# Patient Record
Sex: Male | Born: 1965
Health system: Southern US, Community
[De-identification: ages and names within clinical notes are randomized; demographics above are authoritative.]

## PROBLEM LIST (undated history)

## (undated) DIAGNOSIS — I10 Essential (primary) hypertension: Secondary | ICD-10-CM

## (undated) HISTORY — PX: RECONSTRUCTION OF EYELID: SHX6576

## (undated) HISTORY — DX: Essential (primary) hypertension: I10

## (undated) HISTORY — PX: LIPOMA EXCISION: SHX5283

---

## 2014-06-03 LAB — BASIC METABOLIC PANEL
BUN: 13 mg/dL (ref 4–21)
Creatinine: 1.2 mg/dL (ref <=1.3)
Glucose: 93 mg/dL

## 2014-06-03 LAB — LIPID PANEL
Cholesterol: 217 mg/dL — AB (ref 0–200)
HDL: 34 mg/dL — AB (ref 35–70)
LDL Cholesterol: 152 mg/dL
Triglycerides: 157 mg/dL (ref 40–160)

## 2015-05-02 DIAGNOSIS — L309 Dermatitis, unspecified: Secondary | ICD-10-CM | POA: Insufficient documentation

## 2015-05-02 DIAGNOSIS — E669 Obesity, unspecified: Secondary | ICD-10-CM | POA: Insufficient documentation

## 2015-05-02 DIAGNOSIS — IMO0001 Reserved for inherently not codable concepts without codable children: Secondary | ICD-10-CM | POA: Insufficient documentation

## 2015-05-02 DIAGNOSIS — R030 Elevated blood-pressure reading, without diagnosis of hypertension: Secondary | ICD-10-CM

## 2015-05-02 DIAGNOSIS — Z0000 Encounter for general adult medical examination without abnormal findings: Secondary | ICD-10-CM | POA: Insufficient documentation

## 2015-06-06 ENCOUNTER — Encounter: Payer: Self-pay | Admitting: Family Medicine

## 2015-06-06 ENCOUNTER — Ambulatory Visit (INDEPENDENT_AMBULATORY_CARE_PROVIDER_SITE_OTHER): Payer: 59 | Admitting: Family Medicine

## 2015-06-06 VITALS — BP 130/80 | HR 78 | Ht 73.0 in | Wt 277.0 lb

## 2015-06-06 DIAGNOSIS — F17219 Nicotine dependence, cigarettes, with unspecified nicotine-induced disorders: Secondary | ICD-10-CM | POA: Diagnosis not present

## 2015-06-06 DIAGNOSIS — E669 Obesity, unspecified: Secondary | ICD-10-CM

## 2015-06-06 DIAGNOSIS — IMO0001 Reserved for inherently not codable concepts without codable children: Secondary | ICD-10-CM

## 2015-06-06 DIAGNOSIS — L309 Dermatitis, unspecified: Secondary | ICD-10-CM

## 2015-06-06 DIAGNOSIS — Z0000 Encounter for general adult medical examination without abnormal findings: Secondary | ICD-10-CM | POA: Diagnosis not present

## 2015-06-06 DIAGNOSIS — R030 Elevated blood-pressure reading, without diagnosis of hypertension: Secondary | ICD-10-CM | POA: Diagnosis not present

## 2015-06-06 LAB — POCT URINALYSIS DIPSTICK
Bilirubin, UA: NEGATIVE
Glucose, UA: NEGATIVE
Ketones, UA: NEGATIVE
Leukocytes, UA: NEGATIVE
NITRITE UA: NEGATIVE
PH UA: 5
Protein, UA: NEGATIVE
RBC UA: NEGATIVE
Spec Grav, UA: >=1.03
Urobilinogen, UA: 0.2

## 2015-06-06 MED ORDER — CLOTRIMAZOLE-BETAMETHASONE 1-0.05 % EX CREA: 1.0000 "application " | TOPICAL_CREAM | Freq: Every day | CUTANEOUS | Status: DC

## 2015-06-06 MED ORDER — HYDROCHLOROTHIAZIDE 12.5 MG PO TABS: 12.5000 mg | ORAL_TABLET | Freq: Every day | ORAL | Status: DC

## 2015-06-06 NOTE — Patient Instructions (Signed)
Smoking: Ways to Quit   Know why you want to quit.   When you quit smoking, your body gets to work repairing damaged tissues. Here are some of the health benefits:   You stop the destruction of your lungs.  Your lungs are better able to fight colds, and other respiratory infections.  You decrease your risk of cancer, heart disease, strokes, and circulation problems.   In addition, when you quit you will:   Feel more in control of your life.  Have better smelling hair, breath, clothes, home, and car.  Have more stamina for activities.  Save money.  Protect your family and friends from the dangers of secondhand smoke.   Smoking is an addictive habit. Most former smokers make several attempts to quit before they finally succeed. So, never say, "I can't." Just keep trying.   Set a quit date.   Set a date for when you will stop smoking. Don't buy cigarettes to carry you beyond your last day. Tell your family and friends you plan to quit, and ask for their support and encouragement. Ask them not to offer you cigarettes.  Make a plan.   5 Days Before Your Quit Date   Think about your reasons for quitting.  Tell your friends and family you are planning to quit.  Stop buying cigarettes.   4 Days Before Your Quit Date   Pay attention to when and why you smoke.  Think of other things to hold in your hand instead of a cigarette.  Think of habits or routines to change.   3 Days Before Your Quit Date   Plan what you will do with the extra money when you stop buying cigarettes.  Think of whom you can reach out to when you need help.   2 Days Before Your Quit Date   Consider buying nonprescription nicotine patches or nicotine gum. Or see your health care provider to get a prescription for the nicotine inhaler, nasal spray, or other medicine that can help.     1 Day Before Your Quit Date   Put away lighters and ashtrays.  Throw away all cigarettes and matches - no emergency stashes  are allowed!  Clean your clothes to get rid of the smell of cigarette smoke.   Quit Day   Keep very busy.  Remind family and friends that this is your quit day.  Stay away from alcohol.  Stay away from places where you used to smoke and people you used to smoke with.  Give yourself a treat or do something else special.   Commit to staying quit.   Make sure that all your cigarettes and ashtrays are thrown away.   If you keep cigarettes or ashtrays around, sooner or later you'll break down and smoke one, then another, then another, and so on. Throw them away. Make it hard to start again.   Because you are used to having something in your mouth, you may want to chew gum as a substitute for smoking. Or munch on carrots or celery.   Spend time with nonsmokers rather than with smokers.   Think of yourself as a nonsmoker. Tell other people that you are a nonsmoker (for example, in restaurants). Stay away from places where there are a lot of smokers, such as bars. Avoid spending time with smokers. You can't tell others not to smoke, but you don't have to sit with them while they do. Plan on walking away from cigarette smoke. Spend time   with nonsmokers and sit in the nonsmoking section of restaurants.   Be prepared for relapse or difficult situations.   Most people who go back to smoking cigarettes do so within the first 3 months after quitting. Many people try 5 or more times before they successfully quit. Avoid drinking alcohol, because it lowers your chances of success. Don't be distracted by the weight you may gain after quitting. Smokers usually do not gain more than 10 pounds when they stop smoking. Learn new ways to improve your mood and overcome depression.   Start an exercise program.   As you become more fit, you will not want the nicotine effects in your body. Regular exercise will also help keep you from gaining weight.   Keep your hands busy.   You may not know what to do with  your hands for a while. Try reading, knitting, needlework, pottery, drawing, making a plastic model, or doing a jigsaw puzzle. Join special interest groups that keep you involved in your hobby.      Take on new activities.   Change your routine. Take on new activities that don't include smoking. Join an exercise group and work out regularly. Sign up for an evening class or a join a study group at your place of worship. Go on more outings with your family or friends. Learn ways to relax and manage stress.   Join a quit-smoking program if it helps.   Some people do better in groups, or with a set of instructions to follow. That's fine, too. Remember, the goal is to quit smoking. It doesn't matter how you do it.   Consider using nicotine replacement therapy.   Nicotine is the drug that is in tobacco. You can use nicotine patches or gum, available without a prescription at your local pharmacy, to help you quit smoking. Quitting smoking is a two-step process. It includes breaking the physical addiction to nicotine and breaking the smoking habit. Nicotine replacement helps take care of the nicotine addiction so that you can focus on breaking the habit.   Your health care provider can prescribe nicotine substitutes that can almost double your chances of quitting for good. They are:   Zyban (bupropion HCL)  nicotine inhaler  nicotine lozenge  nicotine nasal spray  nicotine patch.   None of these treatments is a miracle cure. Quitting can be hard work, but you can learn to live without cigarettes in your daily life.  

## 2015-06-06 NOTE — Progress Notes (Signed)
Name: Adam Olson   MRN: [id number]    DOB: 28-Jun-1966   Date:06/06/2015       Progress Note  Subjective  Chief Complaint  Chief Complaint  Patient presents with  . Annual Exam    no complaints  . Hypertension    need refill    Hypertension This is a recurrent problem. The current episode started more than 1 year ago. The problem has been resolved since onset. The problem is controlled. Pertinent negatives include no anxiety, blurred vision, chest pain, headaches, malaise/fatigue, neck pain, orthopnea, palpitations, peripheral edema, PND, shortness of breath or sweats. There are no associated agents to hypertension. There are no known risk factors for coronary artery disease. Past treatments include diuretics. The current treatment provides mild improvement. There are no compliance problems.  There is no history of angina, kidney disease, CAD/MI, CVA, heart failure, left ventricular hypertrophy, PVD or retinopathy. There is no history of chronic renal disease.    No problem-specific assessment & plan notes found for this encounter.   Past Medical History  Diagnosis Date  . Hypertension     Past Surgical History  Procedure Laterality Date  . Lipoma excision      scapula  . Reconstruction of eyelid      Family History  Problem Relation Age of Onset  . Cancer Maternal Grandmother   . Cancer Maternal Grandfather     History   Social History  . Marital Status: Unknown    Spouse Name: N/A  . Number of Children: N/A  . Years of Education: N/A   Occupational History  . Not on file.   Social History Main Topics  . Smoking status: Current Some Day Smoker    Types: Cigars  . Smokeless tobacco: Not on file  . Alcohol Use: 0.0 oz/week    0 Standard drinks or equivalent per week  . Drug Use: No  . Sexual Activity: Not Currently   Other Topics Concern  . Not on file   Social History Narrative    No Known Allergies   Review of Systems  Constitutional: Negative  for fever, weight loss and malaise/fatigue.  HENT: Negative.   Eyes: Negative for blurred vision.  Respiratory: Negative for shortness of breath.   Cardiovascular: Negative for chest pain, palpitations, orthopnea and PND.  Gastrointestinal: Negative for heartburn, nausea and vomiting.  Genitourinary: Negative for dysuria, urgency and hematuria.  Musculoskeletal: Negative for neck pain.  Skin: Positive for rash.       feet  Neurological: Negative for dizziness, tingling, loss of consciousness and headaches.  Endo/Heme/Allergies: Negative.   Psychiatric/Behavioral: Negative for depression and suicidal ideas. The patient is not nervous/anxious.      Objective  Filed Vitals:   06/06/15 0853  BP: 130/80  Pulse: 78  Height: 6\' 1"  (1.854 m)  Weight: 277 lb (125.646 kg)    Physical Exam  Constitutional: He is oriented to person, place, and time and well-developed, well-nourished, and in no distress.  HENT:  Head: Normocephalic.  Right Ear: External ear normal.  Left Ear: External ear normal.  Nose: Nose normal.  Mouth/Throat: Oropharynx is clear and moist.  Eyes: Conjunctivae and EOM are normal. Pupils are equal, round, and reactive to light. Right eye exhibits no discharge. Left eye exhibits no discharge. No scleral icterus.  Neck: Normal range of motion. Neck supple. No JVD present. No tracheal deviation present. No thyromegaly present.  Cardiovascular: Normal rate, regular rhythm, normal heart sounds and intact distal pulses.  Exam  reveals no gallop and no friction rub.   No murmur heard. Pulmonary/Chest: Effort normal and breath sounds normal. No respiratory distress. He has no wheezes. He has no rales.  Abdominal: Soft. Bowel sounds are normal. He exhibits no mass. There is no hepatosplenomegaly. There is no tenderness. There is no rebound, no guarding and no CVA tenderness.  Genitourinary: Prostate normal and penis normal. Guaiac positive stool.  Musculoskeletal: Normal range  of motion. He exhibits no edema or tenderness.  Lymphadenopathy:    He has no cervical adenopathy.  Neurological: He is alert and oriented to person, place, and time. He has normal sensation, normal strength, normal reflexes and intact cranial nerves. No cranial nerve deficit.  Skin: Skin is warm. No rash noted.  Psychiatric: Mood and affect normal.  Nursing note and vitals reviewed.     Assessment & Plan  Problem List Items Addressed This Visit      Musculoskeletal and Integument   Dermatitis, eczematoid   Relevant Medications   clotrimazole-betamethasone (LOTRISONE) cream     Other   Blood pressure elevated   Relevant Medications   hydrochlorothiazide (HYDRODIURIL) 12.5 MG tablet   Other Relevant Orders   Renal Function Panel   POCT Urinalysis Dipstick   Adiposity   Relevant Orders   Lipid Profile    Other Visit Diagnoses    Annual physical exam    -  Primary    Relevant Orders    POCT Occult Blood Stool    Cigarette nicotine dependence with nicotine-induced disorder             Dr. Elizabeth Sauer [facility]  06/06/2015

## 2015-06-07 LAB — LIPID PANEL
Chol/HDL Ratio: 5.2 ratio units — ABNORMAL HIGH (ref 0.0–5.0)
Cholesterol, Total: 241 mg/dL — ABNORMAL HIGH (ref 100–199)
HDL: 46 mg/dL (ref >39)
LDL CALC: 172 mg/dL — AB (ref 0–99)
TRIGLYCERIDES: 113 mg/dL (ref 0–149)
VLDL CHOLESTEROL CAL: 23 mg/dL (ref 5–40)

## 2015-06-07 LAB — RENAL FUNCTION PANEL: PHOSPHORUS: 3.4 mg/dL (ref 2.5–4.5)

## 2016-04-24 DIAGNOSIS — G4733 Obstructive sleep apnea (adult) (pediatric): Secondary | ICD-10-CM | POA: Diagnosis not present

## 2016-04-24 DIAGNOSIS — J343 Hypertrophy of nasal turbinates: Secondary | ICD-10-CM | POA: Diagnosis not present

## 2016-04-24 DIAGNOSIS — R0683 Snoring: Secondary | ICD-10-CM | POA: Diagnosis not present

## 2016-04-30 ENCOUNTER — Encounter: Payer: Self-pay | Admitting: Family Medicine

## 2016-04-30 ENCOUNTER — Ambulatory Visit (INDEPENDENT_AMBULATORY_CARE_PROVIDER_SITE_OTHER): Payer: BLUE CROSS/BLUE SHIELD | Admitting: Family Medicine

## 2016-04-30 VITALS — BP 148/100 | HR 100 | Ht 73.0 in | Wt 278.0 lb

## 2016-04-30 DIAGNOSIS — G4730 Sleep apnea, unspecified: Secondary | ICD-10-CM

## 2016-04-30 DIAGNOSIS — G4733 Obstructive sleep apnea (adult) (pediatric): Secondary | ICD-10-CM | POA: Diagnosis not present

## 2016-04-30 DIAGNOSIS — I10 Essential (primary) hypertension: Secondary | ICD-10-CM | POA: Diagnosis not present

## 2016-04-30 MED ORDER — LISINOPRIL-HYDROCHLOROTHIAZIDE 10-12.5 MG PO TABS: 1.0000 | ORAL_TABLET | Freq: Every day | ORAL | Status: DC

## 2016-04-30 NOTE — Progress Notes (Signed)
Name: Adam Olson   MRN: [id number]    DOB: 12-May-1966   Date:04/30/2016       Progress Note  Subjective  Chief Complaint  Chief Complaint  Patient presents with  . Hypertension    B/P has been elevated- 167/110-120    HPI Comments: Patient having sleep apnea symptoms.  Positive: snoring/ apneic episode/   Negative daytime somnolence/ sitting in chair/ driving/   Hypertension This is a chronic problem. The current episode started in the past 7 days. The problem has been waxing and waning since onset. The problem is uncontrolled. Pertinent negatives include no anxiety, blurred vision, chest pain, headaches, malaise/fatigue, neck pain, orthopnea, palpitations, peripheral edema, PND, shortness of breath or sweats. There are no associated agents to hypertension. Risk factors for coronary artery disease include obesity, male gender and smoking/tobacco exposure. Past treatments include diuretics. The current treatment provides moderate improvement. There are no compliance problems.  There is no history of angina, kidney disease, CAD/MI, CVA, heart failure, left ventricular hypertrophy, PVD, renovascular disease or retinopathy. There is no history of chronic renal disease or a hypertension causing med.    No problem-specific assessment & plan notes found for this encounter.   Past Medical History  Diagnosis Date  . Hypertension     Past Surgical History  Procedure Laterality Date  . Lipoma excision      scapula  . Reconstruction of eyelid      Family History  Problem Relation Age of Onset  . Cancer Maternal Grandmother   . Cancer Maternal Grandfather     Social History   Social History  . Marital Status: Unknown    Spouse Name: N/A  . Number of Children: N/A  . Years of Education: N/A   Occupational History  . Not on file.   Social History Main Topics  . Smoking status: Current Every Day Smoker    Types: Cigars  . Smokeless tobacco: Not on file  . Alcohol Use: 0.0  oz/week    0 Standard drinks or equivalent per week  . Drug Use: No  . Sexual Activity: Not Currently   Other Topics Concern  . Not on file   Social History Narrative    No Known Allergies   Review of Systems  Constitutional: Negative for fever, chills, weight loss and malaise/fatigue.  HENT: Negative for ear discharge, ear pain and sore throat.   Eyes: Negative for blurred vision.  Respiratory: Negative for cough, sputum production, shortness of breath and wheezing.   Cardiovascular: Negative for chest pain, palpitations, orthopnea, leg swelling and PND.  Gastrointestinal: Negative for heartburn, nausea, abdominal pain, diarrhea, constipation, blood in stool and melena.  Genitourinary: Negative for dysuria, urgency, frequency and hematuria.  Musculoskeletal: Negative for myalgias, back pain, joint pain and neck pain.  Skin: Negative for rash.  Neurological: Negative for dizziness, tingling, sensory change, focal weakness and headaches.  Endo/Heme/Allergies: Negative for environmental allergies and polydipsia. Does not bruise/bleed easily.  Psychiatric/Behavioral: Negative for depression and suicidal ideas. The patient is not nervous/anxious and does not have insomnia.      Objective  Filed Vitals:   04/30/16 1013  BP: 148/100  Pulse: 100  Height:  (1.854 m)  Weight: 278 lb (126.1 kg)    Physical Exam  Constitutional: He is oriented to person, place, and time and well-developed, well-nourished, and in no distress.  HENT:  Head: Normocephalic.  Right Ear: External ear normal.  Left Ear: External ear normal.  Nose: Nose normal.  Mouth/Throat: Oropharynx is clear and moist.  Eyes: Conjunctivae and EOM are normal. Pupils are equal, round, and reactive to light. Right eye exhibits no discharge. Left eye exhibits no discharge. No scleral icterus.  Neck: Normal range of motion. Neck supple. No JVD present. No tracheal deviation present. No thyromegaly present.   Cardiovascular: Normal rate, regular rhythm, normal heart sounds and intact distal pulses.  Exam reveals no gallop and no friction rub.   No murmur heard. Pulmonary/Chest: Breath sounds normal. No respiratory distress. He has no wheezes. He has no rales.  Abdominal: Soft. Bowel sounds are normal. He exhibits no mass. There is no hepatosplenomegaly. There is no tenderness. There is no rebound, no guarding and no CVA tenderness.  Musculoskeletal: Normal range of motion. He exhibits no edema or tenderness.  Lymphadenopathy:    He has no cervical adenopathy.  Neurological: He is alert and oriented to person, place, and time. He has normal sensation, normal strength and intact cranial nerves. No cranial nerve deficit.  Skin: Skin is warm. No rash noted.  Psychiatric: Mood and affect normal.  Nursing note and vitals reviewed.     Assessment & Plan  Problem List Items Addressed This Visit    None    Visit Diagnoses    Essential hypertension    -  Primary    Relevant Medications    lisinopril-hydrochlorothiazide (PRINZIDE,ZESTORETIC) 10-12.5 MG tablet    Sleep apnea in adult             Dr. Hayden Rasmusseneanna Ione [facility]  04/30/2016

## 2016-05-21 ENCOUNTER — Encounter: Payer: Self-pay | Admitting: Family Medicine

## 2016-05-22 ENCOUNTER — Ambulatory Visit (INDEPENDENT_AMBULATORY_CARE_PROVIDER_SITE_OTHER): Payer: BLUE CROSS/BLUE SHIELD | Admitting: Family Medicine

## 2016-05-22 ENCOUNTER — Encounter: Payer: Self-pay | Admitting: Family Medicine

## 2016-05-22 VITALS — BP 120/82 | HR 72 | Ht 73.0 in | Wt 280.0 lb

## 2016-05-22 DIAGNOSIS — E669 Obesity, unspecified: Secondary | ICD-10-CM

## 2016-05-22 DIAGNOSIS — I10 Essential (primary) hypertension: Secondary | ICD-10-CM | POA: Diagnosis not present

## 2016-05-22 DIAGNOSIS — Z1211 Encounter for screening for malignant neoplasm of colon: Secondary | ICD-10-CM

## 2016-05-22 DIAGNOSIS — Z0000 Encounter for general adult medical examination without abnormal findings: Secondary | ICD-10-CM

## 2016-05-22 LAB — HEMOCCULT GUIAC POC 1CARD (OFFICE): Fecal Occult Blood, POC: NEGATIVE

## 2016-05-22 MED ORDER — LISINOPRIL-HYDROCHLOROTHIAZIDE 10-12.5 MG PO TABS: 1.0000 | ORAL_TABLET | Freq: Every day | ORAL | Status: DC

## 2016-05-22 NOTE — Progress Notes (Signed)
Name: Adam Olson   MRN: [id number]    DOB: 1966/09/01   Date:05/22/2016       Progress Note  Subjective  Chief Complaint  Chief Complaint  Patient presents with  . Annual Exam    no issues    HPI Comments: Patient presents for physical exam.   No problem-specific assessment & plan notes found for this encounter.   Past Medical History  Diagnosis Date  . Hypertension     Past Surgical History  Procedure Laterality Date  . Lipoma excision      scapula  . Reconstruction of eyelid      Family History  Problem Relation Age of Onset  . Cancer Maternal Grandmother   . Cancer Maternal Grandfather     Social History   Social History  . Marital Status: Unknown    Spouse Name: N/A  . Number of Children: N/A  . Years of Education: N/A   Occupational History  . Not on file.   Social History Main Topics  . Smoking status: Current Every Day Smoker    Types: Cigars  . Smokeless tobacco: Not on file  . Alcohol Use: 0.0 oz/week    0 Standard drinks or equivalent per week  . Drug Use: No  . Sexual Activity: Not Currently   Other Topics Concern  . Not on file   Social History Narrative    No Known Allergies   Review of Systems  Constitutional: Negative for fever, chills, weight loss and malaise/fatigue.  HENT: Negative for ear discharge, ear pain and sore throat.   Eyes: Negative for blurred vision.  Respiratory: Negative for cough, sputum production, shortness of breath and wheezing.   Cardiovascular: Negative for chest pain, palpitations and leg swelling.  Gastrointestinal: Negative for heartburn, nausea, abdominal pain, diarrhea, constipation, blood in stool and melena.  Genitourinary: Negative for dysuria, urgency, frequency and hematuria.  Musculoskeletal: Negative for myalgias, back pain, joint pain and neck pain.  Skin: Negative for rash.  Neurological: Negative for dizziness, tingling, sensory change, focal weakness and headaches.   Endo/Heme/Allergies: Negative for environmental allergies and polydipsia. Does not bruise/bleed easily.  Psychiatric/Behavioral: Negative for depression and suicidal ideas. The patient is not nervous/anxious and does not have insomnia.      Objective  Filed Vitals:   05/22/16 0948  BP: 120/82  Pulse: 72  Height: 6\' 1"  (1.854 m)  Weight: 280 lb (127.007 kg)    Physical Exam  Constitutional: He is oriented to person, place, and time and well-developed, well-nourished, and in no distress.  HENT:  Head: Normocephalic.  Right Ear: External ear normal.  Left Ear: External ear normal.  Nose: Nose normal.  Mouth/Throat: Oropharynx is clear and moist.  Eyes: Conjunctivae and EOM are normal. Pupils are equal, round, and reactive to light. Right eye exhibits no discharge. Left eye exhibits no discharge. No scleral icterus.  Neck: Normal range of motion. Neck supple. No JVD present. No tracheal deviation present. No thyromegaly present.  Cardiovascular: Normal rate, regular rhythm, normal heart sounds and intact distal pulses.  Exam reveals no gallop and no friction rub.   No murmur heard. Pulmonary/Chest: Breath sounds normal. No respiratory distress. He has no wheezes. He has no rales.  Abdominal: Soft. Bowel sounds are normal. He exhibits no mass. There is no hepatosplenomegaly. There is no tenderness. There is no rebound, no guarding and no CVA tenderness.  Genitourinary: Rectum normal and prostate normal.  Musculoskeletal: Normal range of motion. He exhibits no edema or  tenderness.  Lymphadenopathy:    He has no cervical adenopathy.  Neurological: He is alert and oriented to person, place, and time. He has normal sensation, normal strength, normal reflexes and intact cranial nerves. No cranial nerve deficit.  Skin: Skin is warm. No rash noted.  Psychiatric: Mood and affect normal.      Assessment & Plan  Problem List Items Addressed This Visit    None    Visit Diagnoses     Annual physical exam    -  Primary    Relevant Orders    POCT Occult Blood Stool (Completed)    Essential hypertension        Relevant Medications    lisinopril-hydrochlorothiazide (PRINZIDE,ZESTORETIC) 10-12.5 MG tablet    Other Relevant Orders    Renal Function Panel    Obese        Relevant Orders    Lipid Profile    Colon cancer screening        Relevant Orders    POCT Occult Blood Stool (Completed)         Dr. Elizabeth Sauer [facility]  05/22/2016

## 2016-05-23 LAB — LIPID PANEL
CHOLESTEROL TOTAL: 219 mg/dL — AB (ref 100–199)
Chol/HDL Ratio: 6.3 ratio units — ABNORMAL HIGH (ref 0.0–5.0)
HDL: 35 mg/dL — ABNORMAL LOW (ref >39)
LDL Calculated: 141 mg/dL — ABNORMAL HIGH (ref 0–99)
Triglycerides: 216 mg/dL — ABNORMAL HIGH (ref 0–149)
VLDL Cholesterol Cal: 43 mg/dL — ABNORMAL HIGH (ref 5–40)

## 2016-05-23 LAB — RENAL FUNCTION PANEL
ALBUMIN: 4.3 g/dL (ref 3.5–5.5)
BUN / CREAT RATIO: 11 (ref 9–20)
BUN: 12 mg/dL (ref 6–24)
CALCIUM: 9.3 mg/dL (ref 8.7–10.2)
CHLORIDE: 97 mmol/L (ref 96–106)
CO2: 24 mmol/L (ref 18–29)
Creatinine, Ser: 1.07 mg/dL (ref 0.76–1.27)
GFR calc non Af Amer: 82 mL/min/{1.73_m2} (ref >59)
GFR, EST AFRICAN AMERICAN: 94 mL/min/{1.73_m2} (ref >59)
Glucose: 106 mg/dL — ABNORMAL HIGH (ref 65–99)
PHOSPHORUS: 3.8 mg/dL (ref 2.5–4.5)
POTASSIUM: 4.6 mmol/L (ref 3.5–5.2)
SODIUM: 142 mmol/L (ref 134–144)

## 2016-06-13 ENCOUNTER — Other Ambulatory Visit: Payer: Self-pay | Admitting: Physician Assistant

## 2016-06-13 DIAGNOSIS — M47817 Spondylosis without myelopathy or radiculopathy, lumbosacral region: Secondary | ICD-10-CM | POA: Diagnosis not present

## 2016-06-13 DIAGNOSIS — M545 Low back pain: Secondary | ICD-10-CM

## 2016-06-22 ENCOUNTER — Ambulatory Visit
Admission: RE | Admit: 2016-06-22 | Discharge: 2016-06-22 | Disposition: A | Discharge status: Home / Self Care | Payer: BLUE CROSS/BLUE SHIELD | Source: Ambulatory Visit | Attending: Physician Assistant | Admitting: Physician Assistant

## 2016-06-22 DIAGNOSIS — M545 Low back pain: Secondary | ICD-10-CM

## 2016-06-22 DIAGNOSIS — M4806 Spinal stenosis, lumbar region: Secondary | ICD-10-CM | POA: Diagnosis not present

## 2016-06-25 DIAGNOSIS — M5116 Intervertebral disc disorders with radiculopathy, lumbar region: Secondary | ICD-10-CM | POA: Diagnosis not present

## 2016-06-26 ENCOUNTER — Other Ambulatory Visit: Payer: Self-pay | Admitting: Family Medicine

## 2016-06-27 DIAGNOSIS — G4733 Obstructive sleep apnea (adult) (pediatric): Secondary | ICD-10-CM | POA: Diagnosis not present

## 2016-06-29 DIAGNOSIS — M5127 Other intervertebral disc displacement, lumbosacral region: Secondary | ICD-10-CM | POA: Diagnosis not present

## 2016-06-29 DIAGNOSIS — M5137 Other intervertebral disc degeneration, lumbosacral region: Secondary | ICD-10-CM | POA: Diagnosis not present

## 2016-07-02 DIAGNOSIS — M545 Low back pain: Secondary | ICD-10-CM | POA: Diagnosis not present

## 2016-07-02 DIAGNOSIS — Z01818 Encounter for other preprocedural examination: Secondary | ICD-10-CM | POA: Diagnosis not present

## 2016-07-05 DIAGNOSIS — M47817 Spondylosis without myelopathy or radiculopathy, lumbosacral region: Secondary | ICD-10-CM | POA: Diagnosis not present

## 2016-07-05 DIAGNOSIS — M5126 Other intervertebral disc displacement, lumbar region: Secondary | ICD-10-CM | POA: Diagnosis not present

## 2016-07-05 DIAGNOSIS — M5127 Other intervertebral disc displacement, lumbosacral region: Secondary | ICD-10-CM | POA: Diagnosis not present

## 2016-07-05 DIAGNOSIS — M4807 Spinal stenosis, lumbosacral region: Secondary | ICD-10-CM | POA: Diagnosis not present

## 2016-07-05 DIAGNOSIS — G510 Bell's palsy: Secondary | ICD-10-CM | POA: Diagnosis not present

## 2016-07-05 DIAGNOSIS — I10 Essential (primary) hypertension: Secondary | ICD-10-CM | POA: Diagnosis not present

## 2016-07-06 DIAGNOSIS — M47817 Spondylosis without myelopathy or radiculopathy, lumbosacral region: Secondary | ICD-10-CM | POA: Diagnosis not present

## 2016-07-06 DIAGNOSIS — G510 Bell's palsy: Secondary | ICD-10-CM | POA: Diagnosis not present

## 2016-07-06 DIAGNOSIS — M5127 Other intervertebral disc displacement, lumbosacral region: Secondary | ICD-10-CM | POA: Diagnosis not present

## 2016-07-06 DIAGNOSIS — M4807 Spinal stenosis, lumbosacral region: Secondary | ICD-10-CM | POA: Diagnosis not present

## 2016-07-06 DIAGNOSIS — I10 Essential (primary) hypertension: Secondary | ICD-10-CM | POA: Diagnosis not present

## 2016-07-18 DIAGNOSIS — G4733 Obstructive sleep apnea (adult) (pediatric): Secondary | ICD-10-CM | POA: Diagnosis not present

## 2016-08-18 DIAGNOSIS — G4733 Obstructive sleep apnea (adult) (pediatric): Secondary | ICD-10-CM | POA: Diagnosis not present

## 2016-09-17 DIAGNOSIS — G4733 Obstructive sleep apnea (adult) (pediatric): Secondary | ICD-10-CM | POA: Diagnosis not present

## 2016-10-18 DIAGNOSIS — G4733 Obstructive sleep apnea (adult) (pediatric): Secondary | ICD-10-CM | POA: Diagnosis not present

## 2016-11-27 DIAGNOSIS — L853 Xerosis cutis: Secondary | ICD-10-CM | POA: Diagnosis not present

## 2016-11-27 DIAGNOSIS — L300 Nummular dermatitis: Secondary | ICD-10-CM | POA: Diagnosis not present

## 2016-11-27 DIAGNOSIS — L810 Postinflammatory hyperpigmentation: Secondary | ICD-10-CM | POA: Diagnosis not present

## 2016-12-29 ENCOUNTER — Other Ambulatory Visit: Payer: Self-pay | Admitting: Family Medicine

## 2017-03-09 ENCOUNTER — Other Ambulatory Visit: Payer: Self-pay | Admitting: Family Medicine

## 2017-03-09 DIAGNOSIS — I10 Essential (primary) hypertension: Secondary | ICD-10-CM

## 2017-03-27 ENCOUNTER — Ambulatory Visit (INDEPENDENT_AMBULATORY_CARE_PROVIDER_SITE_OTHER): Payer: BLUE CROSS/BLUE SHIELD | Admitting: Family Medicine

## 2017-03-27 VITALS — BP 120/78 | HR 76 | Ht 73.0 in | Wt 274.0 lb

## 2017-03-27 DIAGNOSIS — I10 Essential (primary) hypertension: Secondary | ICD-10-CM | POA: Diagnosis not present

## 2017-03-27 MED ORDER — LISINOPRIL-HYDROCHLOROTHIAZIDE 10-12.5 MG PO TABS: 1.0000 | ORAL_TABLET | Freq: Every day | ORAL | 1 refills | Status: DC

## 2017-03-27 MED ORDER — HYDROCHLOROTHIAZIDE 12.5 MG PO CAPS: 12.5000 mg | ORAL_CAPSULE | Freq: Every day | ORAL | 1 refills | Status: DC

## 2017-03-27 NOTE — Progress Notes (Signed)
Name: Adam Olson   MRN: [id number]    DOB: 1966-04-02   Date:03/27/2017       Progress Note  Subjective  Chief Complaint  Chief Complaint  Patient presents with  . Hypertension    added additional med for b/p    Hypertension  This is a chronic problem. The current episode started more than 1 year ago. The problem has been gradually improving since onset. The problem is controlled. Pertinent negatives include no anxiety, blurred vision, chest pain, headaches, malaise/fatigue, neck pain, orthopnea, palpitations, peripheral edema, PND, shortness of breath or sweats. There are no associated agents to hypertension. There are no known risk factors for coronary artery disease. Past treatments include ACE inhibitors and diuretics. The current treatment provides mild improvement. There are no compliance problems.  There is no history of angina, kidney disease, CAD/MI, CVA, heart failure, left ventricular hypertrophy or PVD. There is no history of chronic renal disease, a hypertension causing med or renovascular disease.    No problem-specific Assessment & Plan notes found for this encounter.   Past Medical History:  Diagnosis Date  . Hypertension     Past Surgical History:  Procedure Laterality Date  . LIPOMA EXCISION     scapula  . RECONSTRUCTION OF EYELID      Family History  Problem Relation Age of Onset  . Cancer Maternal Grandmother   . Cancer Maternal Grandfather     Social History   Social History  . Marital status: Unknown    Spouse name: N/A  . Number of children: N/A  . Years of education: N/A   Occupational History  . Not on file.   Social History Main Topics  . Smoking status: Current Every Day Smoker    Types: Cigars  . Smokeless tobacco: Not on file  . Alcohol use 0.0 oz/week  . Drug use: No  . Sexual activity: Not Currently   Other Topics Concern  . Not on file   Social History Narrative  . No narrative on file    No Known  Allergies  Outpatient Medications Prior to Visit  Medication Sig Dispense Refill  . clotrimazole-betamethasone (LOTRISONE) cream Apply 1 application topically daily. 30 g 3  . hydrochlorothiazide (MICROZIDE) 12.5 MG capsule TAKE ONE CAPSULE BY MOUTH DAILY 90 capsule 0  . lisinopril-hydrochlorothiazide (PRINZIDE,ZESTORETIC) 10-12.5 MG tablet TAKE 1 TABLET BY MOUTH EVERY DAY 15 tablet 0   No facility-administered medications prior to visit.     Review of Systems  Constitutional: Negative for chills, fever, malaise/fatigue and weight loss.  HENT: Negative for ear discharge, ear pain and sore throat.   Eyes: Negative for blurred vision.  Respiratory: Negative for cough, sputum production, shortness of breath and wheezing.   Cardiovascular: Negative for chest pain, palpitations, orthopnea, leg swelling and PND.  Gastrointestinal: Negative for abdominal pain, blood in stool, constipation, diarrhea, heartburn, melena and nausea.  Genitourinary: Negative for dysuria, frequency, hematuria and urgency.  Musculoskeletal: Negative for back pain, joint pain, myalgias and neck pain.  Skin: Negative for rash.  Neurological: Negative for dizziness, tingling, sensory change, focal weakness and headaches.  Endo/Heme/Allergies: Negative for environmental allergies and polydipsia. Does not bruise/bleed easily.  Psychiatric/Behavioral: Negative for depression and suicidal ideas. The patient is not nervous/anxious and does not have insomnia.      Objective  Vitals:   03/27/17 0922  BP: 120/78  Pulse: 76  Weight: 274 lb (124.3 kg)  Height:  (1.854 m)    Physical Exam  Constitutional:  He is oriented to person, place, and time and well-developed, well-nourished, and in no distress.  HENT:  Head: Normocephalic.  Right Ear: External ear normal.  Left Ear: External ear normal.  Nose: Nose normal.  Mouth/Throat: Oropharynx is clear and moist.  Eyes: Conjunctivae and EOM are normal. Pupils are  equal, round, and reactive to light. Right eye exhibits no discharge. Left eye exhibits no discharge. No scleral icterus.  Neck: Normal range of motion. Neck supple. No JVD present. No tracheal deviation present. No thyromegaly present.  Cardiovascular: Normal rate, regular rhythm, normal heart sounds and intact distal pulses.  Exam reveals no gallop and no friction rub.   No murmur heard. Pulmonary/Chest: Breath sounds normal. No respiratory distress. He has no wheezes. He has no rales.  Abdominal: Soft. Bowel sounds are normal. He exhibits no mass. There is no hepatosplenomegaly. There is no tenderness. There is no rebound, no guarding and no CVA tenderness.  Musculoskeletal: Normal range of motion. He exhibits no edema or tenderness.  Lymphadenopathy:    He has no cervical adenopathy.  Neurological: He is alert and oriented to person, place, and time. He has normal sensation, normal strength and intact cranial nerves. No cranial nerve deficit.  Skin: Skin is warm. No rash noted.  Psychiatric: Mood and affect normal.  Nursing note and vitals reviewed.     Assessment & Plan  Problem List Items Addressed This Visit    None    Visit Diagnoses    Essential hypertension    -  Primary   Relevant Medications   lisinopril-hydrochlorothiazide (PRINZIDE,ZESTORETIC) 10-12.5 MG tablet   hydrochlorothiazide (MICROZIDE) 12.5 MG capsule   Other Relevant Orders   Renal function panel      Meds ordered this encounter  Medications  . lisinopril-hydrochlorothiazide (PRINZIDE,ZESTORETIC) 10-12.5 MG tablet    Sig: Take 1 tablet by mouth daily.    Dispense:  90 tablet    Refill:  1    Pt not seen in 9 months- sched appt  . hydrochlorothiazide (MICROZIDE) 12.5 MG capsule    Sig: Take 1 capsule (12.5 mg total) by mouth daily.    Dispense:  90 capsule    Refill:  1    sched appt for meds      Dr. Elizabeth Sauer [facility]  03/27/17

## 2017-03-28 LAB — RENAL FUNCTION PANEL
ALBUMIN: 4.6 g/dL (ref 3.5–5.5)
BUN / CREAT RATIO: 10 (ref 9–20)
BUN: 11 mg/dL (ref 6–24)
CALCIUM: 9.5 mg/dL (ref 8.7–10.2)
CO2: 26 mmol/L (ref 18–29)
CREATININE: 1.07 mg/dL (ref 0.76–1.27)
Chloride: 99 mmol/L (ref 96–106)
GFR calc Af Amer: 94 mL/min/{1.73_m2} (ref >59)
GFR, EST NON AFRICAN AMERICAN: 81 mL/min/{1.73_m2} (ref >59)
Glucose: 105 mg/dL — ABNORMAL HIGH (ref 65–99)
Phosphorus: 4.2 mg/dL (ref 2.5–4.5)
Potassium: 4.7 mmol/L (ref 3.5–5.2)
SODIUM: 144 mmol/L (ref 134–144)

## 2017-04-01 ENCOUNTER — Other Ambulatory Visit: Payer: Self-pay | Admitting: Family Medicine

## 2017-04-08 DIAGNOSIS — G4733 Obstructive sleep apnea (adult) (pediatric): Secondary | ICD-10-CM | POA: Diagnosis not present

## 2017-05-14 ENCOUNTER — Ambulatory Visit (INDEPENDENT_AMBULATORY_CARE_PROVIDER_SITE_OTHER): Payer: BLUE CROSS/BLUE SHIELD | Admitting: Family Medicine

## 2017-05-14 ENCOUNTER — Encounter: Payer: Self-pay | Admitting: Family Medicine

## 2017-05-14 VITALS — BP 110/78 | HR 88 | Temp 98.8°F | Resp 12 | Ht 73.0 in | Wt 269.0 lb

## 2017-05-14 DIAGNOSIS — E860 Dehydration: Secondary | ICD-10-CM | POA: Diagnosis not present

## 2017-05-14 DIAGNOSIS — R55 Syncope and collapse: Secondary | ICD-10-CM | POA: Diagnosis not present

## 2017-05-14 DIAGNOSIS — F17210 Nicotine dependence, cigarettes, uncomplicated: Secondary | ICD-10-CM | POA: Diagnosis not present

## 2017-05-14 DIAGNOSIS — J40 Bronchitis, not specified as acute or chronic: Secondary | ICD-10-CM | POA: Diagnosis not present

## 2017-05-14 DIAGNOSIS — R000 Tachycardia, unspecified: Secondary | ICD-10-CM | POA: Diagnosis not present

## 2017-05-14 DIAGNOSIS — I10 Essential (primary) hypertension: Secondary | ICD-10-CM | POA: Diagnosis not present

## 2017-05-14 DIAGNOSIS — M791 Myalgia: Secondary | ICD-10-CM | POA: Diagnosis not present

## 2017-05-14 DIAGNOSIS — R05 Cough: Secondary | ICD-10-CM | POA: Diagnosis not present

## 2017-05-14 DIAGNOSIS — R509 Fever, unspecified: Secondary | ICD-10-CM | POA: Diagnosis not present

## 2017-05-14 DIAGNOSIS — J0100 Acute maxillary sinusitis, unspecified: Secondary | ICD-10-CM

## 2017-05-14 LAB — POCT INFLUENZA A/B
INFLUENZA B, POC: NEGATIVE
Influenza A, POC: NEGATIVE

## 2017-05-14 MED ORDER — GUAIFENESIN-CODEINE 100-10 MG/5ML PO SYRP: 5.0000 mL | ORAL_SOLUTION | Freq: Three times a day (TID) | ORAL | 0 refills | Status: DC | PRN

## 2017-05-14 MED ORDER — LEVOFLOXACIN 500 MG PO TABS: 500.0000 mg | ORAL_TABLET | Freq: Every day | ORAL | 0 refills | Status: DC

## 2017-05-14 NOTE — Progress Notes (Signed)
Name: Adam Olson   MRN: [id number]    DOB: Apr 20, 1966   Date:05/14/2017       Progress Note  Subjective  Chief Complaint  Chief Complaint  Patient presents with  . Sinusitis    dry cough and chills, sweating    Sinusitis  This is a new problem. The maximum temperature recorded prior to his arrival was 100.4 - 100.9 F. The fever has been present for 3 to 4 days. Associated symptoms include chills, congestion, coughing and diaphoresis. Pertinent negatives include no ear pain, headaches, hoarse voice, neck pain, shortness of breath, sinus pressure, sneezing, sore throat or swollen glands. (Myalgia) Past treatments include oral decongestants.  Cough  This is a new problem. The current episode started in the past 7 days. The problem has been gradually worsening. The cough is productive of purulent sputum. Associated symptoms include chills, myalgias, nasal congestion, postnasal drip, rhinorrhea and sweats. Pertinent negatives include no chest pain, ear pain, fever, headaches, heartburn, rash, sore throat, shortness of breath, weight loss or wheezing. There is no history of environmental allergies.    No problem-specific Assessment & Plan notes found for this encounter.   Past Medical History:  Diagnosis Date  . Hypertension     Past Surgical History:  Procedure Laterality Date  . LIPOMA EXCISION     scapula  . RECONSTRUCTION OF EYELID      Family History  Problem Relation Age of Onset  . Cancer Maternal Grandmother   . Cancer Maternal Grandfather     Social History   Social History  . Marital status: Unknown    Spouse name: N/A  . Number of children: N/A  . Years of education: N/A   Occupational History  . Not on file.   Social History Main Topics  . Smoking status: Current Every Day Smoker    Types: Cigars  . Smokeless tobacco: Never Used  . Alcohol use 0.0 oz/week  . Drug use: No  . Sexual activity: Not Currently   Other Topics Concern  . Not on file    Social History Narrative  . No narrative on file    No Known Allergies  Outpatient Medications Prior to Visit  Medication Sig Dispense Refill  . clotrimazole-betamethasone (LOTRISONE) cream Apply 1 application topically daily. 30 g 3  . hydrochlorothiazide (MICROZIDE) 12.5 MG capsule TAKE ONE CAPSULE BY MOUTH DAILY 90 capsule 0  . lisinopril-hydrochlorothiazide (PRINZIDE,ZESTORETIC) 10-12.5 MG tablet Take 1 tablet by mouth daily. 90 tablet 1  . hydrochlorothiazide (MICROZIDE) 12.5 MG capsule Take 1 capsule (12.5 mg total) by mouth daily. 90 capsule 1   No facility-administered medications prior to visit.     Review of Systems  Constitutional: Positive for chills and diaphoresis. Negative for fever, malaise/fatigue and weight loss.  HENT: Positive for congestion, postnasal drip and rhinorrhea. Negative for ear discharge, ear pain, hoarse voice, sinus pressure, sneezing and sore throat.   Eyes: Negative for blurred vision.  Respiratory: Positive for cough. Negative for sputum production, shortness of breath and wheezing.   Cardiovascular: Negative for chest pain, palpitations and leg swelling.  Gastrointestinal: Negative for abdominal pain, blood in stool, constipation, diarrhea, heartburn, melena and nausea.  Genitourinary: Negative for dysuria, frequency, hematuria and urgency.  Musculoskeletal: Positive for myalgias. Negative for back pain, joint pain and neck pain.  Skin: Negative for rash.  Neurological: Negative for dizziness, tingling, sensory change, focal weakness and headaches.  Endo/Heme/Allergies: Negative for environmental allergies and polydipsia. Does not bruise/bleed easily.  Psychiatric/Behavioral: Negative  for depression and suicidal ideas. The patient is not nervous/anxious and does not have insomnia.      Objective  Vitals:   05/14/17 1106 05/14/17 1143  BP: 110/78   Pulse: (!) 120 88  Resp: 14 12  Temp: (!) 101.2 F (38.4 C) 98.8 F (37.1 C)  TempSrc:  Oral   SpO2: 96% 97%  Weight: 269 lb (122 kg)   Height: 6\' 1"  (1.854 m)     Physical Exam  Constitutional: He is oriented to person, place, and time and well-developed, well-nourished, and in no distress.  HENT:  Head: Normocephalic.  Right Ear: Tympanic membrane, external ear and ear canal normal.  Left Ear: Tympanic membrane, external ear and ear canal normal.  Nose: Nose normal.  Mouth/Throat: Oropharynx is clear and moist. Mucous membranes are not pale, dry and not cyanotic. No oropharyngeal exudate, posterior oropharyngeal edema or posterior oropharyngeal erythema.  Eyes: Conjunctivae and EOM are normal. Pupils are equal, round, and reactive to light. Right eye exhibits no discharge. Left eye exhibits no discharge. No scleral icterus.  Neck: Normal range of motion. Neck supple. No JVD present. No tracheal deviation present. No thyromegaly present.  Cardiovascular: Normal rate, regular rhythm, normal heart sounds and intact distal pulses.  Exam reveals no gallop and no friction rub.   No murmur heard. Pulmonary/Chest: Breath sounds normal. No respiratory distress. He has no wheezes. He has no rales.  Abdominal: Soft. Bowel sounds are normal. He exhibits no mass. There is no hepatosplenomegaly. There is no tenderness. There is no rebound, no guarding and no CVA tenderness.  Musculoskeletal: Normal range of motion. He exhibits no edema or tenderness.  Lymphadenopathy:    He has no cervical adenopathy.  Neurological: He is alert and oriented to person, place, and time. He has normal sensation, normal strength, normal reflexes and intact cranial nerves. No cranial nerve deficit.  Skin: Skin is warm. No rash noted.  Psychiatric: Mood and affect normal.  Nursing note and vitals reviewed.     Assessment & Plan  Problem List Items Addressed This Visit    None    Visit Diagnoses    Bronchitis    -  Primary   Relevant Medications   levofloxacin (LEVAQUIN) 500 MG tablet    guaiFENesin-codeine (ROBITUSSIN AC) 100-10 MG/5ML syrup   Acute maxillary sinusitis, recurrence not specified       Relevant Medications   levofloxacin (LEVAQUIN) 500 MG tablet   guaiFENesin-codeine (ROBITUSSIN AC) 100-10 MG/5ML syrup   Fever, unspecified fever cause       Relevant Medications   levofloxacin (LEVAQUIN) 500 MG tablet   Other Relevant Orders   POCT Influenza A/B (Completed)   Vasovagal episode       Dehydration          Meds ordered this encounter  Medications  . levofloxacin (LEVAQUIN) 500 MG tablet    Sig: Take 1 tablet (500 mg total) by mouth daily.    Dispense:  7 tablet    Refill:  0  . guaiFENesin-codeine (ROBITUSSIN AC) 100-10 MG/5ML syrup    Sig: Take 5 mLs by mouth 3 (three) times daily as needed for cough.    Dispense:  150 mL    Refill:  0   Hold on chest xray and levaquin- send to ER for further eval due to near syncopal episode and breaking out in a sweat. Influenza was negative but needs further eval.   Dr. Hayden [facility]  05/14/17   

## 2017-07-21 IMAGING — MR MR LUMBAR SPINE W/O CM
4 of 5 series · 26 of 48 positions shown · non-contrast
Comparison: Lumbar radiographs 06/13/2016.

CLINICAL DATA: 50-year-old male with lumbar back pain for 4 months.
Perineal numbness. No known injury. Subsequent encounter.

EXAM:
MRI LUMBAR SPINE WITHOUT CONTRAST
TECHNIQUE: Multiplanar, multisequence MR imaging of the lumbar spine was
performed. No intravenous contrast was administered.

[Series 3: T2 · sagittal · 4.0mm · 0.55mm/px · 6 of 14 slices shown (1 of 2)]
[im 1/14]
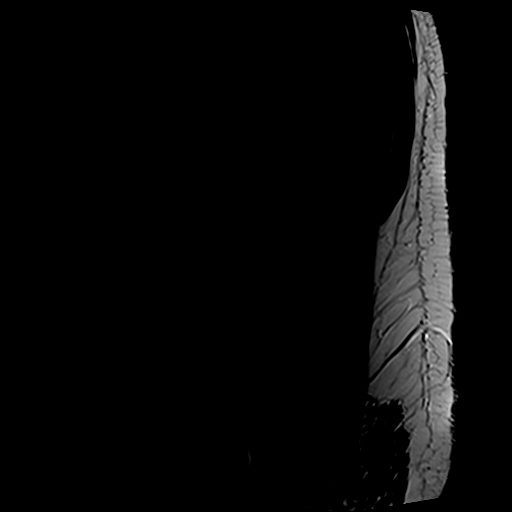
[im 3/14]
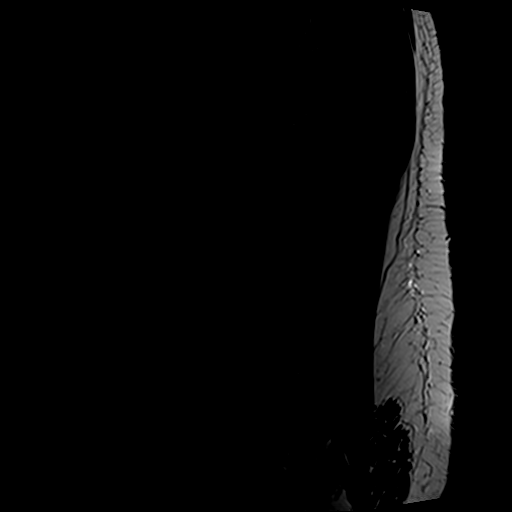
[im 6/14]
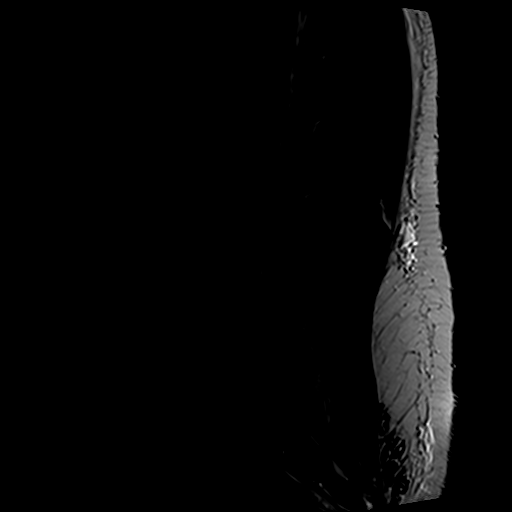
[im 8/14]
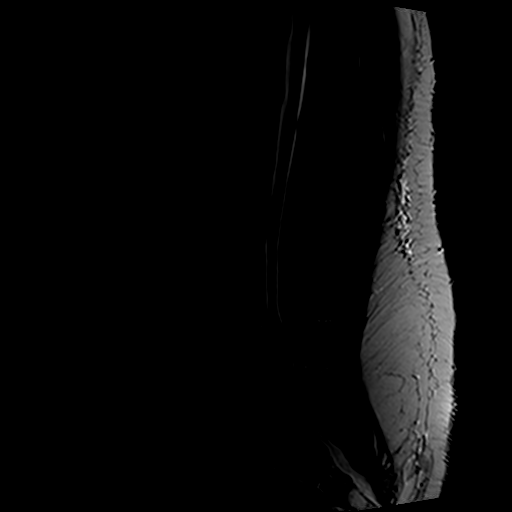
[im 11/14]
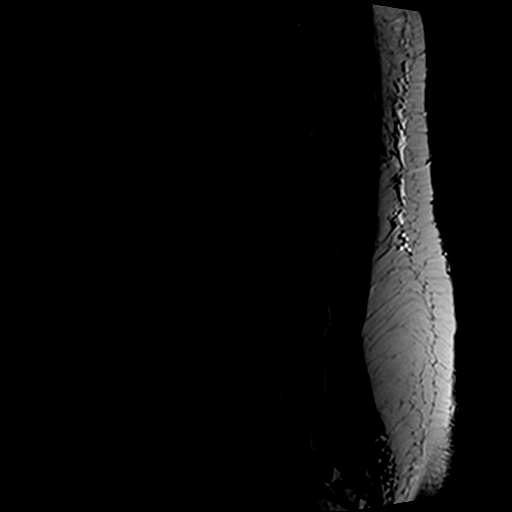
[im 14/14]
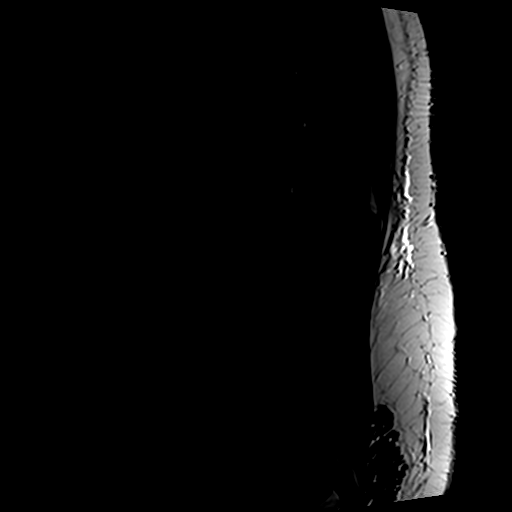

[Series 4: T1 · sagittal · 4.0mm · 0.55mm/px · 5 of 14 slices shown (1 of 2)]
[im 1/14]
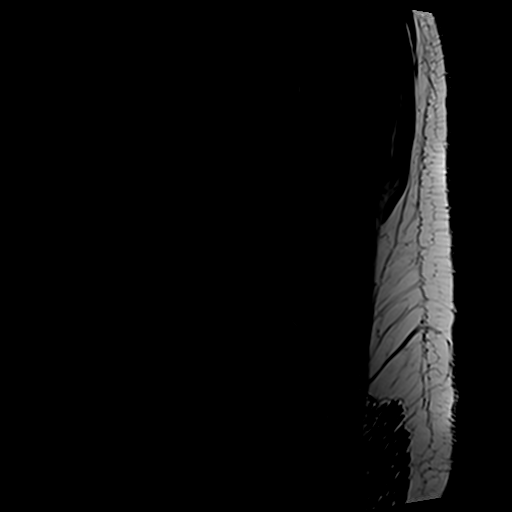
[im 4/14]
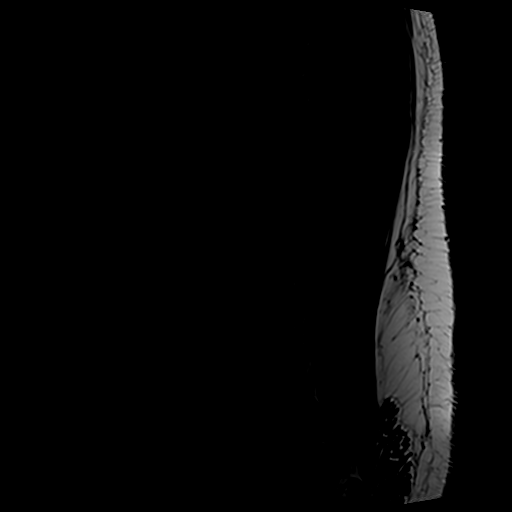
[im 7/14]
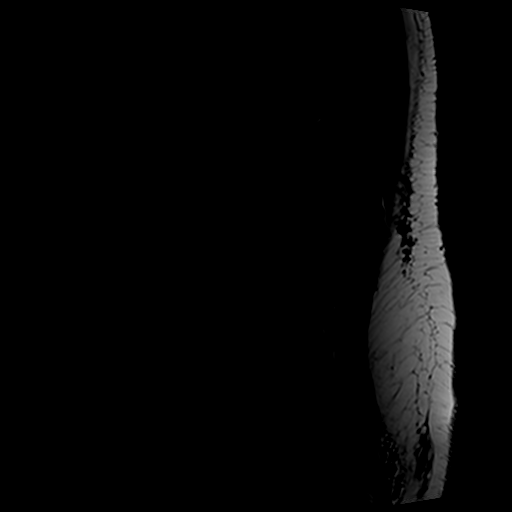
[im 10/14]
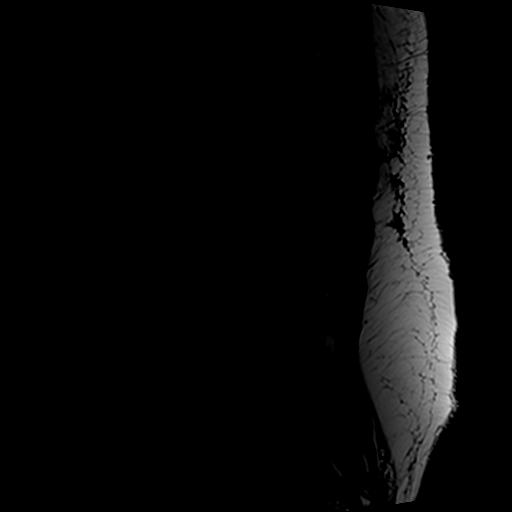
[im 14/14]
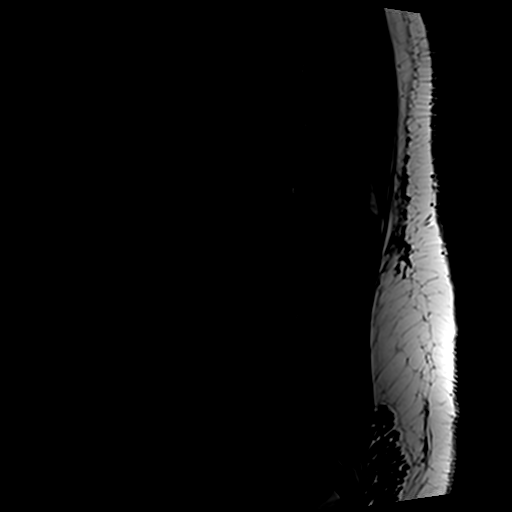

[Series 6: T2 · axial · 4.0mm · 0.78mm/px · z∈[-154,+61]mm · 10 of 43 slices shown (2 of 2)]
[im 3/43]
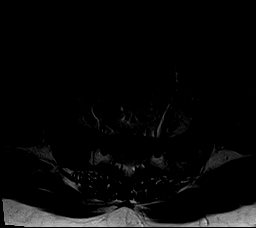
[im 6/43]
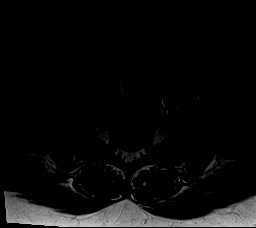
[im 9/43]
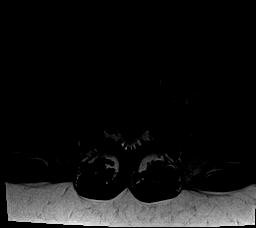
[im 15/43]
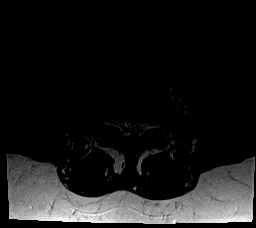
[im 20/43]
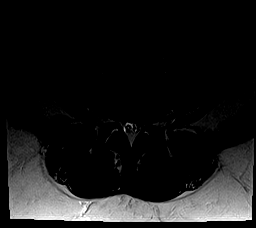
[im 23/43]
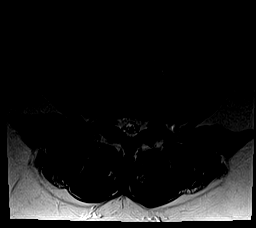
[im 26/43]
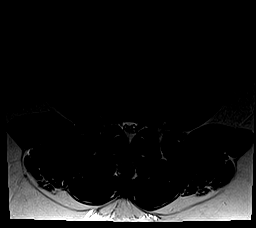
[im 31/43]
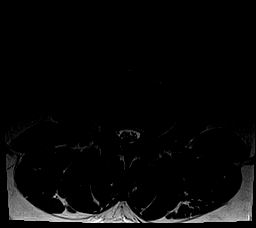
[im 37/43]
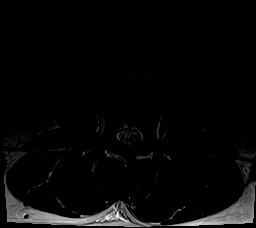
[im 43/43]
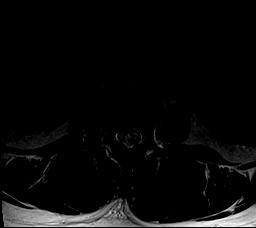

[Series 7: T1 · axial · 4.0mm · 0.39mm/px · z∈[-154,+30]mm · 5 of 43 slices shown (2 of 2)]
[im 3/43]
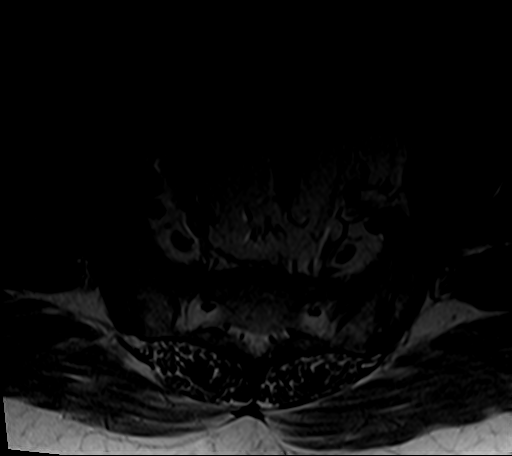
[im 6/43]
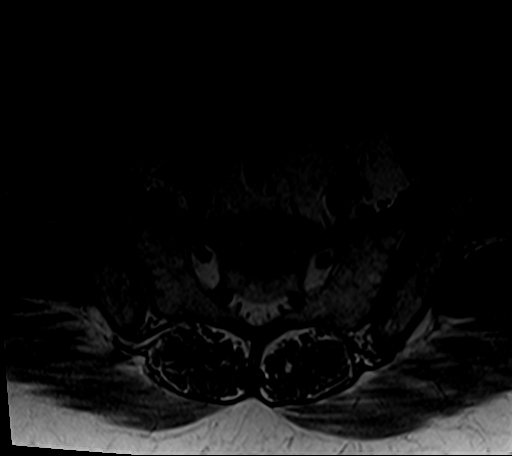
[im 9/43]
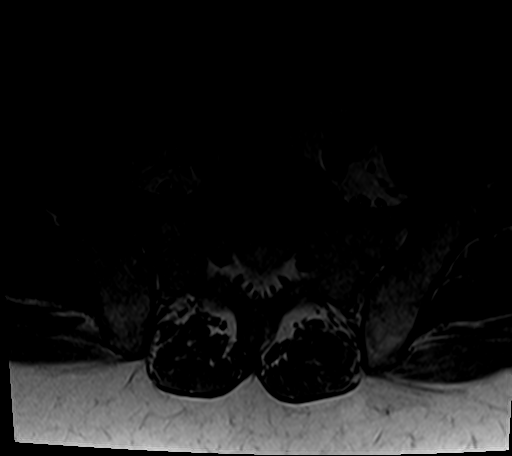
[im 23/43]
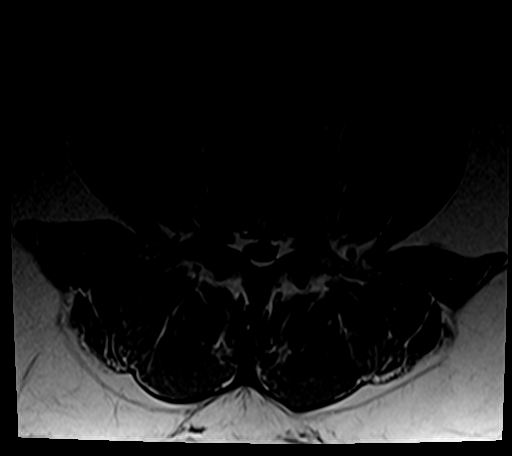
[im 37/43]
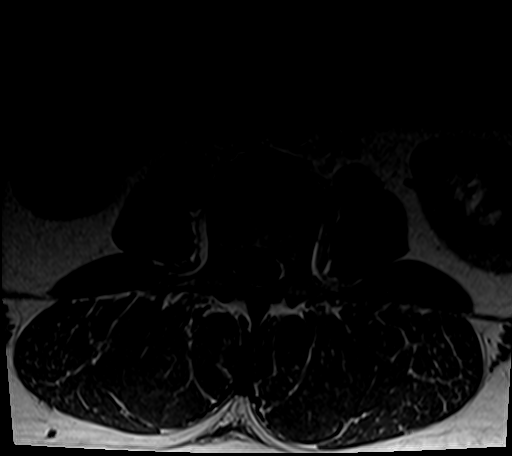

[26 of 48 positions shown; findings below may reference images not displayed]

FINDINGS: Segmentation: Normal, as demonstrated on the comparison radiographs.

Alignment:  Stable with mild straightening of lumbar lordosis.

Vertebrae: Mild degenerative appearing anterior and left lateral
endplate marrow edema inferiorly at L5. Otherwise normal bone marrow
signal. No other acute osseous abnormality.

Conus medullaris: Extends to the T12-L1 level and appears normal.

Paraspinal and other soft tissues: Visualized abdominal viscera and
paraspinal soft tissues are within normal limits.

Disc levels:

There is a degree of congenital lumbar spinal canal narrowing
diffusely. There is superimposed epidural lipomatosis which
exacerbates the subsequent spinal stenosis. Still the following
superimposed degenerative changes are noted:

T12-L1:  Negative.

L1-L2: Minimal disc bulge and facet hypertrophy. Overall mild spinal
stenosis.

L2-L3: Minimal disc bulge and facet hypertrophy. Epidural
lipomatosis. Overall mild to moderate spinal stenosis.

L3-L4: Minimal disc bulge. Mild facet hypertrophy. Epidural
lipomatosis. Overall moderate spinal stenosis (series 6, image 17).

L4-L5: Mild disc bulge with broad-based posterior component. Mild
endplate spurring. Mild facet hypertrophy. Epidural lipomatosis.
Overall moderate spinal stenosis (series 6, image 24).

L5-S1: Disc space loss with vacuum disc. Mild circumferential disc
osteophyte complex with superimposed moderate to large rightward
disc extrusion (series 3, image 7 and series 6, image 31).
Subsequent severe spinal and right lateral recess stenosis
(descending right S1 nerve root level). No foraminal involvement.
IMPRESSION: 1. L5-S1 disc and endplate degeneration with a moderate to large
rightward disc herniation which is primarily responsible for severe
spinal and right lateral recess stenosis.
2. Underlying widespread congenital +/- acquired up to moderate
lumbar spinal stenosis, excluding the L5-S1 level.
These results will be called to the ordering clinician or
representative by the Radiologist Assistant, and communication
documented in the PACS or zVision Dashboard.

## 2017-10-02 ENCOUNTER — Other Ambulatory Visit: Payer: Self-pay | Admitting: Family Medicine

## 2017-10-02 DIAGNOSIS — I10 Essential (primary) hypertension: Secondary | ICD-10-CM

## 2017-10-07 DIAGNOSIS — L0292 Furuncle, unspecified: Secondary | ICD-10-CM | POA: Diagnosis not present

## 2017-10-07 DIAGNOSIS — L853 Xerosis cutis: Secondary | ICD-10-CM | POA: Diagnosis not present

## 2017-10-07 DIAGNOSIS — L300 Nummular dermatitis: Secondary | ICD-10-CM | POA: Diagnosis not present

## 2017-10-07 DIAGNOSIS — L810 Postinflammatory hyperpigmentation: Secondary | ICD-10-CM | POA: Diagnosis not present

## 2017-11-02 ENCOUNTER — Other Ambulatory Visit: Payer: Self-pay | Admitting: Family Medicine

## 2017-11-02 DIAGNOSIS — I10 Essential (primary) hypertension: Secondary | ICD-10-CM

## 2017-12-05 ENCOUNTER — Other Ambulatory Visit: Payer: Self-pay | Admitting: Family Medicine

## 2017-12-05 DIAGNOSIS — I10 Essential (primary) hypertension: Secondary | ICD-10-CM

## 2018-01-08 ENCOUNTER — Other Ambulatory Visit: Payer: Self-pay

## 2018-02-03 ENCOUNTER — Other Ambulatory Visit: Payer: Self-pay | Admitting: Family Medicine

## 2018-02-05 ENCOUNTER — Other Ambulatory Visit: Payer: Self-pay

## 2018-02-21 ENCOUNTER — Other Ambulatory Visit: Payer: Self-pay

## 2018-02-21 DIAGNOSIS — I10 Essential (primary) hypertension: Secondary | ICD-10-CM

## 2018-02-21 MED ORDER — HYDROCHLOROTHIAZIDE 12.5 MG PO CAPS: 12.5000 mg | ORAL_CAPSULE | Freq: Every day | ORAL | 0 refills | Status: DC

## 2018-02-21 MED ORDER — LISINOPRIL-HYDROCHLOROTHIAZIDE 10-12.5 MG PO TABS: ORAL_TABLET | ORAL | 0 refills | Status: DC

## 2018-02-26 ENCOUNTER — Encounter: Payer: Self-pay | Admitting: Family Medicine

## 2018-02-26 ENCOUNTER — Ambulatory Visit (INDEPENDENT_AMBULATORY_CARE_PROVIDER_SITE_OTHER): Payer: BLUE CROSS/BLUE SHIELD | Admitting: Family Medicine

## 2018-02-26 VITALS — BP 132/80 | HR 76 | Ht 73.0 in | Wt 278.0 lb

## 2018-02-26 DIAGNOSIS — Z23 Encounter for immunization: Secondary | ICD-10-CM | POA: Diagnosis not present

## 2018-02-26 DIAGNOSIS — L920 Granuloma annulare: Secondary | ICD-10-CM | POA: Diagnosis not present

## 2018-02-26 DIAGNOSIS — L309 Dermatitis, unspecified: Secondary | ICD-10-CM | POA: Diagnosis not present

## 2018-02-26 DIAGNOSIS — I10 Essential (primary) hypertension: Secondary | ICD-10-CM | POA: Diagnosis not present

## 2018-02-26 MED ORDER — LISINOPRIL-HYDROCHLOROTHIAZIDE 10-12.5 MG PO TABS: ORAL_TABLET | ORAL | 1 refills | Status: DC

## 2018-02-26 MED ORDER — HYDROCHLOROTHIAZIDE 12.5 MG PO CAPS: 12.5000 mg | ORAL_CAPSULE | Freq: Every day | ORAL | 1 refills | Status: DC

## 2018-02-26 MED ORDER — CLOTRIMAZOLE-BETAMETHASONE 1-0.05 % EX CREA: 1.0000 "application " | TOPICAL_CREAM | Freq: Every day | CUTANEOUS | 3 refills | Status: DC

## 2018-02-26 NOTE — Progress Notes (Signed)
Name: Adam Olson   MRN: [id number]    DOB: 1966-04-03   Date:02/26/2018       Progress Note  Subjective  Chief Complaint  Chief Complaint  Patient presents with  . Hypertension    Hypertension  This is a chronic problem. The current episode started more than 1 year ago. The problem is unchanged. The problem is controlled. Pertinent negatives include no anxiety, blurred vision, chest pain, headaches, malaise/fatigue, neck pain, orthopnea, palpitations, peripheral edema, PND, shortness of breath or sweats. There are no associated agents to hypertension. There are no known risk factors for coronary artery disease. Past treatments include ACE inhibitors and diuretics. The current treatment provides mild improvement. There are no compliance problems.  There is no history of angina, kidney disease, CAD/MI, CVA, heart failure, left ventricular hypertrophy, PVD or retinopathy. There is no history of chronic renal disease, a hypertension causing med or renovascular disease.    No problem-specific Assessment & Plan notes found for this encounter.   Past Medical History:  Diagnosis Date  . Hypertension     Past Surgical History:  Procedure Laterality Date  . LIPOMA EXCISION     scapula  . RECONSTRUCTION OF EYELID      Family History  Problem Relation Age of Onset  . Cancer Maternal Grandmother   . Cancer Maternal Grandfather     Social History   Socioeconomic History  . Marital status: Unknown    Spouse name: Not on file  . Number of children: Not on file  . Years of education: Not on file  . Highest education level: Not on file  Social Needs  . Financial resource strain: Not on file  . Food insecurity - worry: Not on file  . Food insecurity - inability: Not on file  . Transportation needs - medical: Not on file  . Transportation needs - non-medical: Not on file  Occupational History  . Not on file  Tobacco Use  . Smoking status: Current Every Day Smoker    Types:  Cigars  . Smokeless tobacco: Never Used  Substance and Sexual Activity  . Alcohol use: Yes    Alcohol/week: 0.0 oz  . Drug use: No  . Sexual activity: Not Currently  Other Topics Concern  . Not on file  Social History Narrative  . Not on file    No Known Allergies  Outpatient Medications Prior to Visit  Medication Sig Dispense Refill  . clotrimazole-betamethasone (LOTRISONE) cream Apply 1 application topically daily. 30 g 3  . hydrochlorothiazide (MICROZIDE) 12.5 MG capsule TAKE 1 CAPSULE (12.5 MG TOTAL) BY MOUTH DAILY. 30 capsule 0  . lisinopril-hydrochlorothiazide (PRINZIDE,ZESTORETIC) 10-12.5 MG tablet TAKE 1 TABLET BY MOUTH EVERY DAY (NEED APPT) 30 tablet 0  . guaiFENesin-codeine (ROBITUSSIN AC) 100-10 MG/5ML syrup Take 5 mLs by mouth 3 (three) times daily as needed for cough. 150 mL 0  . hydrochlorothiazide (MICROZIDE) 12.5 MG capsule Take 1 capsule (12.5 mg total) by mouth daily. 30 capsule 0  . levofloxacin (LEVAQUIN) 500 MG tablet Take 1 tablet (500 mg total) by mouth daily. 7 tablet 0   No facility-administered medications prior to visit.     Review of Systems  Constitutional: Negative for chills, fever, malaise/fatigue and weight loss.  HENT: Negative for ear discharge, ear pain and sore throat.   Eyes: Negative for blurred vision.  Respiratory: Negative for cough, sputum production, shortness of breath and wheezing.   Cardiovascular: Negative for chest pain, palpitations, orthopnea, leg swelling and PND.  Gastrointestinal: Negative for abdominal pain, blood in stool, constipation, diarrhea, heartburn, melena and nausea.  Genitourinary: Negative for dysuria, frequency, hematuria and urgency.  Musculoskeletal: Negative for back pain, joint pain, myalgias and neck pain.  Skin: Negative for rash.  Neurological: Negative for dizziness, tingling, sensory change, focal weakness and headaches.  Endo/Heme/Allergies: Negative for environmental allergies and polydipsia. Does not  bruise/bleed easily.  Psychiatric/Behavioral: Negative for depression and suicidal ideas. The patient is not nervous/anxious and does not have insomnia.      Objective  Vitals:   02/26/18 1010  BP: 132/80  Pulse: 76  Weight: 278 lb (126.1 kg)  Height: 6\' 1"  (1.854 m)    Physical Exam  Constitutional: He is oriented to person, place, and time and well-developed, well-nourished, and in no distress.  HENT:  Head: Normocephalic.  Right Ear: External ear normal.  Left Ear: External ear normal.  Nose: Nose normal.  Mouth/Throat: Oropharynx is clear and moist.  Eyes: Conjunctivae and EOM are normal. Pupils are equal, round, and reactive to light. Right eye exhibits no discharge. Left eye exhibits no discharge. No scleral icterus.  Neck: Normal range of motion. Neck supple. No JVD present. No tracheal deviation present. No thyromegaly present.  Cardiovascular: Normal rate, regular rhythm, normal heart sounds and intact distal pulses. Exam reveals no gallop and no friction rub.  No murmur heard. Pulmonary/Chest: Breath sounds normal. No respiratory distress. He has no wheezes. He has no rales.  Abdominal: Soft. Bowel sounds are normal. He exhibits no mass. There is no hepatosplenomegaly. There is no tenderness. There is no rebound, no guarding and no CVA tenderness.  Musculoskeletal: Normal range of motion. He exhibits no edema or tenderness.  Lymphadenopathy:    He has no cervical adenopathy.  Neurological: He is alert and oriented to person, place, and time. He has normal sensation, normal strength and intact cranial nerves.  Skin: Skin is warm. Rash noted. Rash is macular.     scaly  Psychiatric: Mood and affect normal.  Nursing note and vitals reviewed.     Assessment & Plan  Problem List Items Addressed This Visit      Musculoskeletal and Integument   Dermatitis, eczematoid   Relevant Medications   clotrimazole-betamethasone (LOTRISONE) cream    Other Visit Diagnoses      Essential hypertension    -  Primary   Relevant Medications   lisinopril-hydrochlorothiazide (PRINZIDE,ZESTORETIC) 10-12.5 MG tablet   hydrochlorothiazide (MICROZIDE) 12.5 MG capsule   Other Relevant Orders   Renal Function Panel   Need for diphtheria-tetanus-pertussis (Tdap) vaccine       Granuloma annulare          Meds ordered this encounter  Medications  . clotrimazole-betamethasone (LOTRISONE) cream    Sig: Apply 1 application topically daily.    Dispense:  30 g    Refill:  3  . lisinopril-hydrochlorothiazide (PRINZIDE,ZESTORETIC) 10-12.5 MG tablet    Sig: TAKE 1 TABLET BY MOUTH EVERY DAY (NEED APPT)    Dispense:  90 tablet    Refill:  1    sched appt in Jan  . hydrochlorothiazide (MICROZIDE) 12.5 MG capsule    Sig: Take 1 capsule (12.5 mg total) by mouth daily.    Dispense:  90 capsule    Refill:  1    sched appt      Dr. Elizabeth Sauer [facility]  02/26/18

## 2018-02-27 LAB — RENAL FUNCTION PANEL
Albumin: 4.6 g/dL (ref 3.5–5.5)
BUN/Creatinine Ratio: 12 (ref 9–20)
BUN: 11 mg/dL (ref 6–24)
CALCIUM: 9.5 mg/dL (ref 8.7–10.2)
CO2: 27 mmol/L (ref 20–29)
CREATININE: 0.95 mg/dL (ref 0.76–1.27)
Chloride: 100 mmol/L (ref 96–106)
GFR calc Af Amer: 107 mL/min/{1.73_m2} (ref >59)
GFR, EST NON AFRICAN AMERICAN: 93 mL/min/{1.73_m2} (ref >59)
GLUCOSE: 110 mg/dL — AB (ref 65–99)
POTASSIUM: 4.1 mmol/L (ref 3.5–5.2)
Phosphorus: 3.4 mg/dL (ref 2.5–4.5)
SODIUM: 141 mmol/L (ref 134–144)

## 2018-03-25 ENCOUNTER — Encounter: Payer: Self-pay | Admitting: Family Medicine

## 2018-03-25 ENCOUNTER — Ambulatory Visit (INDEPENDENT_AMBULATORY_CARE_PROVIDER_SITE_OTHER): Payer: BLUE CROSS/BLUE SHIELD | Admitting: Family Medicine

## 2018-03-25 VITALS — BP 138/88 | HR 88 | Ht 73.0 in | Wt 278.0 lb

## 2018-03-25 DIAGNOSIS — I10 Essential (primary) hypertension: Secondary | ICD-10-CM

## 2018-03-25 DIAGNOSIS — Z0000 Encounter for general adult medical examination without abnormal findings: Secondary | ICD-10-CM

## 2018-03-25 DIAGNOSIS — Z23 Encounter for immunization: Secondary | ICD-10-CM | POA: Diagnosis not present

## 2018-03-25 DIAGNOSIS — Z1211 Encounter for screening for malignant neoplasm of colon: Secondary | ICD-10-CM | POA: Diagnosis not present

## 2018-03-25 LAB — HEMOCCULT GUIAC POC 1CARD (OFFICE): FECAL OCCULT BLD: NEGATIVE

## 2018-03-25 NOTE — Progress Notes (Signed)
Name: Adam Olson   MRN: [id number]    DOB: Aug 01, 1966   Date:03/25/2018       Progress Note  Subjective  Chief Complaint  Chief Complaint  Patient presents with  . Annual Exam  . tdap    Patient presents for annual physical exam.   No problem-specific Assessment & Plan notes found for this encounter.   Past Medical History:  Diagnosis Date  . Hypertension     Past Surgical History:  Procedure Laterality Date  . LIPOMA EXCISION     scapula  . RECONSTRUCTION OF EYELID      Family History  Problem Relation Age of Onset  . Cancer Maternal Grandmother   . Cancer Maternal Grandfather     Social History   Socioeconomic History  . Marital status: Unknown    Spouse name: Not on file  . Number of children: Not on file  . Years of education: Not on file  . Highest education level: Not on file  Occupational History  . Not on file  Social Needs  . Financial resource strain: Not on file  . Food insecurity:    Worry: Not on file    Inability: Not on file  . Transportation needs:    Medical: Not on file    Non-medical: Not on file  Tobacco Use  . Smoking status: Current Every Day Smoker    Types: Cigars  . Smokeless tobacco: Never Used  Substance and Sexual Activity  . Alcohol use: Yes    Alcohol/week: 0.0 oz  . Drug use: No  . Sexual activity: Not Currently  Lifestyle  . Physical activity:    Days per week: Not on file    Minutes per session: Not on file  . Stress: Not on file  Relationships  . Social connections:    Talks on phone: Not on file    Gets together: Not on file    Attends religious service: Not on file    Active member of club or organization: Not on file    Attends meetings of clubs or organizations: Not on file    Relationship status: Not on file  . Intimate partner violence:    Fear of current or ex partner: Not on file    Emotionally abused: Not on file    Physically abused: Not on file    Forced sexual activity: Not on file   Other Topics Concern  . Not on file  Social History Narrative  . Not on file    No Known Allergies  Outpatient Medications Prior to Visit  Medication Sig Dispense Refill  . clotrimazole-betamethasone (LOTRISONE) cream Apply 1 application topically daily. 30 g 3  . hydrochlorothiazide (MICROZIDE) 12.5 MG capsule Take 1 capsule (12.5 mg total) by mouth daily. 90 capsule 1  . lisinopril-hydrochlorothiazide (PRINZIDE,ZESTORETIC) 10-12.5 MG tablet TAKE 1 TABLET BY MOUTH EVERY DAY (NEED APPT) 90 tablet 1   No facility-administered medications prior to visit.     Review of Systems  Constitutional: Negative for chills, fever, malaise/fatigue and weight loss.  HENT: Negative for ear discharge, ear pain and sore throat.   Eyes: Negative for blurred vision.  Respiratory: Negative for cough, sputum production, shortness of breath and wheezing.   Cardiovascular: Negative for chest pain, palpitations and leg swelling.  Gastrointestinal: Negative for abdominal pain, blood in stool, constipation, diarrhea, heartburn, melena and nausea.  Genitourinary: Negative for dysuria, frequency, hematuria and urgency.  Musculoskeletal: Negative for back pain, joint pain, myalgias and neck pain.  Skin: Negative for rash.  Neurological: Negative for dizziness, tingling, sensory change, focal weakness and headaches.  Endo/Heme/Allergies: Negative for environmental allergies and polydipsia. Does not bruise/bleed easily.  Psychiatric/Behavioral: Negative for depression and suicidal ideas. The patient is not nervous/anxious and does not have insomnia.      Objective  Vitals:   03/25/18 0841  BP: 138/88  Pulse: 88  Weight: 278 lb (126.1 kg)  Height: 6\' 1"  (1.854 m)    Physical Exam  Constitutional: He is oriented to person, place, and time and well-developed, well-nourished, and in no distress. Vital signs are normal.  HENT:  Head: Normocephalic.  Right Ear: Hearing, tympanic membrane, external ear and  ear canal normal.  Left Ear: Hearing, tympanic membrane, external ear and ear canal normal.  Nose: Nose normal.  Mouth/Throat: Uvula is midline, oropharynx is clear and moist and mucous membranes are normal.  Eyes: Pupils are equal, round, and reactive to light. Conjunctivae, EOM and lids are normal. Right eye exhibits no discharge. Left eye exhibits no discharge. No scleral icterus.  Fundoscopic exam:      The right eye shows no arteriolar narrowing and no AV nicking.       The left eye shows no arteriolar narrowing and no AV nicking.  Neck: Trachea normal and normal range of motion. Neck supple. Normal carotid pulses, no hepatojugular reflux and no JVD present. No tracheal tenderness present. Carotid bruit is not present. No tracheal deviation present. No thyroid mass and no thyromegaly present.  Cardiovascular: Normal rate, regular rhythm, S1 normal, S2 normal, normal heart sounds, intact distal pulses and normal pulses. PMI is not displaced. Exam reveals no gallop, no S3, no S4, no distant heart sounds and no friction rub.  No murmur heard. Pulmonary/Chest: Breath sounds normal. No respiratory distress. He has no wheezes. He has no rales. Right breast exhibits no inverted nipple, no mass, no nipple discharge, no skin change and no tenderness. Left breast exhibits no inverted nipple, no mass, no nipple discharge, no skin change and no tenderness. Breasts are symmetrical.  Abdominal: Soft. Normal aorta and bowel sounds are normal. He exhibits no mass. There is no hepatosplenomegaly. There is no tenderness. There is no rebound, no guarding and no CVA tenderness.  Genitourinary: Rectum normal, prostate normal, testes/scrotum normal and penis normal.  Musculoskeletal: Normal range of motion. He exhibits no edema or tenderness.       Lumbar back: Normal.  scar  Lymphadenopathy:       Head (right side): No submental and no submandibular adenopathy present.       Head (left side): No submental and no  submandibular adenopathy present.    He has no cervical adenopathy.    He has no axillary adenopathy.  Neurological: He is alert and oriented to person, place, and time. He has normal sensation, normal strength, normal reflexes and intact cranial nerves. No cranial nerve deficit.  Skin: Skin is warm, dry and intact. No rash noted.  Psychiatric: Mood and affect normal.  Nursing note and vitals reviewed.     Assessment & Plan  Problem List Items Addressed This Visit    None    Visit Diagnoses    Annual physical exam    -  Primary   Relevant Orders   POCT occult blood stool (Completed)   Lipid Panel With LDL/HDL Ratio   Need for diphtheria-tetanus-pertussis (Tdap) vaccine       Relevant Orders   Tdap vaccine greater than or equal to 7yo IM (Completed)  Colon cancer screening       Relevant Orders   POCT occult blood stool (Completed)   Essential hypertension       Relevant Orders   Lipid Panel With LDL/HDL Ratio      No orders of the defined types were placed in this encounter.     Dr. Hayden Rasmusseneanna Jones [facility]  03/25/18

## 2018-03-26 LAB — LIPID PANEL WITH LDL/HDL RATIO
Cholesterol, Total: 240 mg/dL — ABNORMAL HIGH (ref 100–199)
HDL: 43 mg/dL (ref >39)
LDL Calculated: 171 mg/dL — ABNORMAL HIGH (ref 0–99)
LDL/HDL RATIO: 4 ratio — AB (ref 0.0–3.6)
Triglycerides: 128 mg/dL (ref 0–149)
VLDL Cholesterol Cal: 26 mg/dL (ref 5–40)

## 2018-04-02 DIAGNOSIS — G4733 Obstructive sleep apnea (adult) (pediatric): Secondary | ICD-10-CM | POA: Diagnosis not present

## 2018-08-11 DIAGNOSIS — J069 Acute upper respiratory infection, unspecified: Secondary | ICD-10-CM | POA: Diagnosis not present

## 2018-09-02 ENCOUNTER — Other Ambulatory Visit: Payer: Self-pay | Admitting: Family Medicine

## 2018-09-02 DIAGNOSIS — I10 Essential (primary) hypertension: Secondary | ICD-10-CM

## 2018-09-13 ENCOUNTER — Other Ambulatory Visit: Payer: Self-pay | Admitting: Family Medicine

## 2018-09-13 DIAGNOSIS — I10 Essential (primary) hypertension: Secondary | ICD-10-CM

## 2018-09-16 ENCOUNTER — Encounter: Payer: Self-pay | Admitting: Family Medicine

## 2018-09-16 ENCOUNTER — Ambulatory Visit (INDEPENDENT_AMBULATORY_CARE_PROVIDER_SITE_OTHER): Payer: BLUE CROSS/BLUE SHIELD | Admitting: Family Medicine

## 2018-09-16 DIAGNOSIS — I10 Essential (primary) hypertension: Secondary | ICD-10-CM | POA: Diagnosis not present

## 2018-09-16 MED ORDER — HYDROCHLOROTHIAZIDE 12.5 MG PO CAPS: ORAL_CAPSULE | ORAL | 1 refills | Status: DC

## 2018-09-16 MED ORDER — LISINOPRIL-HYDROCHLOROTHIAZIDE 10-12.5 MG PO TABS: ORAL_TABLET | ORAL | 1 refills | Status: DC

## 2018-09-16 NOTE — Progress Notes (Signed)
Date:  09/16/2018   Name:  Adam Olson   DOB:  21-Jul-1966   MRN:  [id number]   Chief Complaint: Hypertension Hypertension  This is a chronic problem. The current episode started more than 1 year ago. The problem is unchanged. The problem is controlled. Pertinent negatives include no anxiety, blurred vision, chest pain, headaches, malaise/fatigue, neck pain, orthopnea, palpitations, peripheral edema, PND, shortness of breath or sweats. There are no associated agents to hypertension. Risk factors for coronary artery disease include male gender, smoking/tobacco exposure and stress. Past treatments include ACE inhibitors and diuretics. The current treatment provides moderate improvement. There are no compliance problems.  There is no history of angina, kidney disease, CAD/MI, CVA, heart failure, left ventricular hypertrophy, PVD or retinopathy. There is no history of chronic renal disease, a hypertension causing med or renovascular disease.     Review of Systems  Constitutional: Negative for chills, fever and malaise/fatigue.  HENT: Negative for drooling, ear discharge, ear pain and sore throat.   Eyes: Negative for blurred vision.  Respiratory: Negative for cough, shortness of breath and wheezing.   Cardiovascular: Negative for chest pain, palpitations, orthopnea, leg swelling and PND.  Gastrointestinal: Negative for abdominal pain, blood in stool, constipation, diarrhea and nausea.  Endocrine: Negative for polydipsia.  Genitourinary: Negative for dysuria, frequency, hematuria and urgency.  Musculoskeletal: Negative for back pain, myalgias and neck pain.  Skin: Negative for rash.  Allergic/Immunologic: Negative for environmental allergies.  Neurological: Negative for dizziness and headaches.  Hematological: Does not bruise/bleed easily.  Psychiatric/Behavioral: Negative for suicidal ideas. The patient is not nervous/anxious.     Patient Active Problem List   Diagnosis Date Noted  .  Routine general medical examination at a health care facility 05/02/2015  . Dermatitis, eczematoid 05/02/2015  . Blood pressure elevated 05/02/2015  . Adiposity 05/02/2015    No Known Allergies  Past Surgical History:  Procedure Laterality Date  . LIPOMA EXCISION     scapula  . RECONSTRUCTION OF EYELID      Social History   Tobacco Use  . Smoking status: Current Every Day Smoker    Types: Cigars  . Smokeless tobacco: Never Used  Substance Use Topics  . Alcohol use: Yes    Alcohol/week: 0.0 standard drinks  . Drug use: No     Medication list has been reviewed and updated.  Current Meds  Medication Sig  . clotrimazole-betamethasone (LOTRISONE) cream Apply 1 application topically daily.  . hydrochlorothiazide (MICROZIDE) 12.5 MG capsule TAKE 1 CAPSULE BY MOUTH EVERY DAY  . lisinopril-hydrochlorothiazide (PRINZIDE,ZESTORETIC) 10-12.5 MG tablet One a day  . [DISCONTINUED] hydrochlorothiazide (MICROZIDE) 12.5 MG capsule TAKE 1 CAPSULE BY MOUTH EVERY DAY  . [DISCONTINUED] lisinopril-hydrochlorothiazide (PRINZIDE,ZESTORETIC) 10-12.5 MG tablet TAKE 1 TABLET BY MOUTH EVERY DAY (NEED APPT)    PHQ 2/9 Scores 02/26/2018 05/22/2016  PHQ - 2 Score 0 0  PHQ- 9 Score 0 -    Physical Exam  Constitutional: He is oriented to person, place, and time.  HENT:  Head: Normocephalic.  Right Ear: External ear normal.  Left Ear: External ear normal.  Nose: Nose normal.  Mouth/Throat: Oropharynx is clear and moist.  Eyes: Pupils are equal, round, and reactive to light. Conjunctivae and EOM are normal. Right eye exhibits no discharge. Left eye exhibits no discharge. No scleral icterus.  Neck: Normal range of motion. Neck supple. No JVD present. No tracheal deviation present. No thyromegaly present.  Cardiovascular: Normal rate, regular rhythm, normal heart sounds and intact distal  pulses. Exam reveals no gallop and no friction rub.  No murmur heard. Pulmonary/Chest: Breath sounds normal. No  respiratory distress. He has no wheezes. He has no rales.  Abdominal: Soft. Bowel sounds are normal. He exhibits no mass. There is no hepatosplenomegaly. There is no tenderness. There is no rebound, no guarding and no CVA tenderness.  Musculoskeletal: Normal range of motion. He exhibits no edema or tenderness.  Lymphadenopathy:    He has no cervical adenopathy.  Neurological: He is alert and oriented to person, place, and time. He has normal strength and normal reflexes. No cranial nerve deficit.  Skin: Skin is warm. No rash noted.  Nursing note and vitals reviewed.   BP 124/82   Pulse 97   Resp 16   Ht 6\' 1"  (1.854 m)   Wt 281 lb (127.5 kg)   SpO2 97%   BMI 37.07 kg/m   Assessment and Plan: 1. Essential hypertension Chronic Controlled Continue lisinopril HCTZ - lisinopril-hydrochlorothiazide (PRINZIDE,ZESTORETIC) 10-12.5 MG tablet; One a day  Dispense: 90 tablet; Refill: 1 - hydrochlorothiazide (MICROZIDE) 12.5 MG capsule; TAKE 1 CAPSULE BY MOUTH EVERY DAY  Dispense: 90 capsule; Refill: 1 Dr. Hayden [facility]  09/16/2018

## 2018-10-07 DIAGNOSIS — G4733 Obstructive sleep apnea (adult) (pediatric): Secondary | ICD-10-CM | POA: Diagnosis not present

## 2019-01-09 ENCOUNTER — Other Ambulatory Visit: Payer: Self-pay | Admitting: Family Medicine

## 2019-01-09 DIAGNOSIS — I10 Essential (primary) hypertension: Secondary | ICD-10-CM

## 2019-01-13 ENCOUNTER — Ambulatory Visit (INDEPENDENT_AMBULATORY_CARE_PROVIDER_SITE_OTHER): Payer: BLUE CROSS/BLUE SHIELD | Admitting: Family Medicine

## 2019-01-13 ENCOUNTER — Encounter: Payer: Self-pay | Admitting: Family Medicine

## 2019-01-13 VITALS — BP 122/80 | HR 80 | Ht 73.0 in | Wt 283.0 lb

## 2019-01-13 DIAGNOSIS — I10 Essential (primary) hypertension: Secondary | ICD-10-CM

## 2019-01-13 DIAGNOSIS — Z23 Encounter for immunization: Secondary | ICD-10-CM

## 2019-01-13 DIAGNOSIS — L309 Dermatitis, unspecified: Secondary | ICD-10-CM

## 2019-01-13 MED ORDER — HYDROCHLOROTHIAZIDE 12.5 MG PO CAPS: ORAL_CAPSULE | ORAL | 1 refills | Status: DC

## 2019-01-13 MED ORDER — LISINOPRIL-HYDROCHLOROTHIAZIDE 10-12.5 MG PO TABS: ORAL_TABLET | ORAL | 1 refills | Status: DC

## 2019-01-13 MED ORDER — CLOTRIMAZOLE-BETAMETHASONE 1-0.05 % EX CREA: 1.0000 "application " | TOPICAL_CREAM | Freq: Every day | CUTANEOUS | 3 refills | Status: DC

## 2019-01-13 NOTE — Progress Notes (Signed)
    Date:  01/13/2019   Name:  Adam Olson   DOB:  1966-11-24   MRN:  [id number]   Chief Complaint: Hypertension and flu vacc need  Hypertension  This is a chronic problem. The current episode started more than 1 year ago. The problem has been gradually improving since onset. The problem is controlled. Pertinent negatives include no anxiety, blurred vision, chest pain, headaches, malaise/fatigue, neck pain, orthopnea, palpitations, peripheral edema, PND, shortness of breath or sweats. There are no associated agents to hypertension.    Review of Systems  Constitutional: Negative for malaise/fatigue.  Eyes: Negative for blurred vision.  Respiratory: Negative for shortness of breath.   Cardiovascular: Negative for chest pain, palpitations, orthopnea and PND.  Musculoskeletal: Negative for neck pain.  Neurological: Negative for headaches.    Patient Active Problem List   Diagnosis Date Noted  . Routine general medical examination at a health care facility 05/02/2015  . Dermatitis, eczematoid 05/02/2015  . Blood pressure elevated 05/02/2015  . Adiposity 05/02/2015    No Known Allergies  Past Surgical History:  Procedure Laterality Date  . LIPOMA EXCISION     scapula  . RECONSTRUCTION OF EYELID      Social History   Tobacco Use  . Smoking status: Current Every Day Smoker    Types: Cigars  . Smokeless tobacco: Never Used  Substance Use Topics  . Alcohol use: Yes    Alcohol/week: 0.0 standard drinks  . Drug use: No     Medication list has been reviewed and updated.  Current Meds  Medication Sig  . clotrimazole-betamethasone (LOTRISONE) cream Apply 1 application topically daily.  . hydrochlorothiazide (MICROZIDE) 12.5 MG capsule TAKE 1 CAPSULE BY MOUTH EVERY DAY  . lisinopril-hydrochlorothiazide (PRINZIDE,ZESTORETIC) 10-12.5 MG tablet TAKE 1 TABLET BY MOUTH EVERY DAY  . [DISCONTINUED] clotrimazole-betamethasone (LOTRISONE) cream Apply 1 application topically daily.   . [DISCONTINUED] hydrochlorothiazide (MICROZIDE) 12.5 MG capsule TAKE 1 CAPSULE BY MOUTH EVERY DAY  . [DISCONTINUED] lisinopril-hydrochlorothiazide (PRINZIDE,ZESTORETIC) 10-12.5 MG tablet TAKE 1 TABLET BY MOUTH EVERY DAY    PHQ 2/9 Scores 02/26/2018 05/22/2016  PHQ - 2 Score 0 0  PHQ- 9 Score 0 -    Physical Exam  BP 122/80   Pulse 80   Ht 6\' 1"  (1.854 m)   Wt 283 lb (128.4 kg)   BMI 37.34 kg/m   Assessment and Plan: 1. Essential hypertension Chronic.  Controlled.  Continue lisinopril-hydrochlorothiazide 10-12.5 mg daily with a supplement of 12.5 mg hydrochlorothiazide daily.  Reviewed previous renal function panel and will obtain the next evaluation at his physical in 6 months - hydrochlorothiazide (MICROZIDE) 12.5 MG capsule; TAKE 1 CAPSULE BY MOUTH EVERY DAY  Dispense: 90 capsule; Refill: 1 - lisinopril-hydrochlorothiazide (PRINZIDE,ZESTORETIC) 10-12.5 MG tablet; TAKE 1 TABLET BY MOUTH EVERY DAY  Dispense: 90 tablet; Refill: 1  2. Eczema, unspecified type And has an area of eczema that is seen by dermatology/Dr. Cheree Ditto.  Will continue Lotrisone cream on a as needed basis. - clotrimazole-betamethasone (LOTRISONE) cream; Apply 1 application topically daily.  Dispense: 30 g; Refill: 3  3. Flu vaccine need Discussion and administered. - Flu Vaccine QUAD 6+ mos PF IM (Fluarix Quad PF)

## 2019-07-08 ENCOUNTER — Other Ambulatory Visit: Payer: Self-pay | Admitting: Family Medicine

## 2019-07-08 DIAGNOSIS — I10 Essential (primary) hypertension: Secondary | ICD-10-CM

## 2019-07-15 ENCOUNTER — Encounter: Payer: Self-pay | Admitting: Family Medicine

## 2019-07-15 ENCOUNTER — Other Ambulatory Visit: Payer: Self-pay

## 2019-07-15 ENCOUNTER — Ambulatory Visit (INDEPENDENT_AMBULATORY_CARE_PROVIDER_SITE_OTHER): Payer: BC Managed Care – PPO | Admitting: Family Medicine

## 2019-07-15 VITALS — BP 120/80 | HR 76 | Ht 73.0 in | Wt 265.0 lb

## 2019-07-15 DIAGNOSIS — R351 Nocturia: Secondary | ICD-10-CM | POA: Diagnosis not present

## 2019-07-15 DIAGNOSIS — L309 Dermatitis, unspecified: Secondary | ICD-10-CM

## 2019-07-15 DIAGNOSIS — R202 Paresthesia of skin: Secondary | ICD-10-CM

## 2019-07-15 DIAGNOSIS — Z0000 Encounter for general adult medical examination without abnormal findings: Secondary | ICD-10-CM | POA: Diagnosis not present

## 2019-07-15 DIAGNOSIS — I10 Essential (primary) hypertension: Secondary | ICD-10-CM

## 2019-07-15 DIAGNOSIS — Z1211 Encounter for screening for malignant neoplasm of colon: Secondary | ICD-10-CM | POA: Diagnosis not present

## 2019-07-15 DIAGNOSIS — E782 Mixed hyperlipidemia: Secondary | ICD-10-CM

## 2019-07-15 LAB — HEMOCCULT GUIAC POC 1CARD (OFFICE): Fecal Occult Blood, POC: NEGATIVE

## 2019-07-15 MED ORDER — HYDROCHLOROTHIAZIDE 12.5 MG PO CAPS: 12.5000 mg | ORAL_CAPSULE | Freq: Every day | ORAL | 1 refills | Status: DC

## 2019-07-15 MED ORDER — CLOTRIMAZOLE-BETAMETHASONE 1-0.05 % EX CREA: 1.0000 "application " | TOPICAL_CREAM | Freq: Every day | CUTANEOUS | 3 refills | Status: DC

## 2019-07-15 MED ORDER — PREDNISONE 10 MG PO TABS: 10.0000 mg | ORAL_TABLET | Freq: Every day | ORAL | 0 refills | Status: DC

## 2019-07-15 MED ORDER — LISINOPRIL-HYDROCHLOROTHIAZIDE 10-12.5 MG PO TABS: ORAL_TABLET | ORAL | 1 refills | Status: DC

## 2019-07-15 NOTE — Progress Notes (Signed)
Date:  07/15/2019   Name:  Adam Olson   DOB:  1966-01-22   MRN:  [id number]   Chief Complaint: Annual Exam, Hypertension, and Foot Pain (numbness on left side of L) foot- getting better/ feels it is coming from sciatica)  Patient is a 53 year old male who presents for a comprehensive physical exam. The patient reports the following problems: radicular numbness. Health maintenance has been reviewed up to date  Hypertension This is a chronic problem. The current episode started more than 1 year ago. The problem is unchanged. The problem is controlled. Pertinent negatives include no anxiety, blurred vision, chest pain, headaches, malaise/fatigue, neck pain, orthopnea, palpitations, peripheral edema, PND, shortness of breath or sweats. There are no associated agents to hypertension. Risk factors for coronary artery disease include smoking/tobacco exposure, obesity and dyslipidemia. Past treatments include ACE inhibitors and diuretics. The current treatment provides moderate improvement. There are no compliance problems.  There is no history of angina, kidney disease, CAD/MI, CVA, heart failure, left ventricular hypertrophy, PVD or retinopathy. There is no history of chronic renal disease, a hypertension causing med or renovascular disease.  Back Pain This is a new problem. The current episode started 1 to 4 weeks ago (2 weeks). The problem occurs intermittently. The problem has been gradually worsening since onset. The pain is present in the lumbar spine and sacro-iliac. The quality of the pain is described as aching. Radiates to: buttock. The pain is at a severity of 6/10. The pain is moderate. The symptoms are aggravated by bending. Associated symptoms include numbness and tingling. Pertinent negatives include no abdominal pain, bladder incontinence, bowel incontinence, chest pain, dysuria, fever, headaches, leg pain, paresis, paresthesias, pelvic pain, perianal numbness, weakness or weight loss.     Review of Systems  Constitutional: Negative for chills, fever, malaise/fatigue and weight loss.  HENT: Negative for drooling, ear discharge, ear pain and sore throat.   Eyes: Negative for blurred vision.  Respiratory: Negative for cough, chest tightness, shortness of breath and wheezing.   Cardiovascular: Negative for chest pain, palpitations, orthopnea, leg swelling and PND.  Gastrointestinal: Negative for abdominal pain, blood in stool, bowel incontinence, constipation, diarrhea and nausea.  Endocrine: Negative for polydipsia.  Genitourinary: Negative for bladder incontinence, dysuria, frequency, hematuria, pelvic pain and urgency.       Occ nocturia  Musculoskeletal: Positive for back pain. Negative for myalgias and neck pain.  Skin: Negative for rash.  Allergic/Immunologic: Negative for environmental allergies.  Neurological: Positive for tingling and numbness. Negative for dizziness, weakness, headaches and paresthesias.  Hematological: Does not bruise/bleed easily.  Psychiatric/Behavioral: Negative for suicidal ideas. The patient is not nervous/anxious.     Patient Active Problem List   Diagnosis Date Noted  . Routine general medical examination at a health care facility 05/02/2015  . Dermatitis, eczematoid 05/02/2015  . Blood pressure elevated 05/02/2015  . Adiposity 05/02/2015    No Known Allergies  Past Surgical History:  Procedure Laterality Date  . LIPOMA EXCISION     scapula  . RECONSTRUCTION OF EYELID      Social History   Tobacco Use  . Smoking status: Current Every Day Smoker    Types: Cigars  . Smokeless tobacco: Never Used  Substance Use Topics  . Alcohol use: Yes    Alcohol/week: 0.0 standard drinks  . Drug use: No     Medication list has been reviewed and updated.  Current Meds  Medication Sig  . clotrimazole-betamethasone (LOTRISONE) cream Apply 1 application  topically daily.  . hydrochlorothiazide (MICROZIDE) 12.5 MG capsule TAKE 1  CAPSULE BY MOUTH EVERY DAY  . lisinopril-hydrochlorothiazide (PRINZIDE,ZESTORETIC) 10-12.5 MG tablet TAKE 1 TABLET BY MOUTH EVERY DAY    PHQ 2/9 Scores 07/15/2019 02/26/2018 05/22/2016  PHQ - 2 Score 0 0 0  PHQ- 9 Score 0 0 -    BP Readings from Last 3 Encounters:  07/15/19 120/80  01/13/19 122/80  09/16/18 124/82    Physical Exam Vitals signs and nursing note reviewed.  Constitutional:      General: He is not in acute distress.    Appearance: He is obese.  HENT:     Head: Normocephalic.     Right Ear: Tympanic membrane, ear canal and external ear normal. There is no impacted cerumen.     Left Ear: Tympanic membrane, ear canal and external ear normal. There is no impacted cerumen.     Nose: Nose normal. No congestion or rhinorrhea.     Mouth/Throat:     Pharynx: No oropharyngeal exudate or posterior oropharyngeal erythema.  Eyes:     General: No scleral icterus.       Right eye: No discharge.        Left eye: No discharge.     Conjunctiva/sclera: Conjunctivae normal.     Pupils: Pupils are equal, round, and reactive to light.  Neck:     Musculoskeletal: Normal range of motion and neck supple.     Thyroid: No thyromegaly.     Vascular: No JVD.     Trachea: No tracheal deviation.  Cardiovascular:     Rate and Rhythm: Normal rate and regular rhythm.     Heart sounds: Normal heart sounds. No murmur. No friction rub. No gallop.   Pulmonary:     Effort: No respiratory distress.     Breath sounds: Normal breath sounds. No wheezing or rales.  Abdominal:     General: Bowel sounds are normal.     Palpations: Abdomen is soft. There is no hepatomegaly, splenomegaly or mass.     Tenderness: There is no abdominal tenderness. There is no right CVA tenderness, left CVA tenderness, guarding or rebound.     Hernia: No hernia is present.  Genitourinary:    Penis: Normal.      Scrotum/Testes: Normal.        Right: Mass not present.        Left: Mass not present.     Epididymis:      Right: Normal.     Left: Normal.  Musculoskeletal: Normal range of motion.        General: No tenderness.  Lymphadenopathy:     Cervical: No cervical adenopathy.  Skin:    General: Skin is warm.     Findings: No rash.  Neurological:     Mental Status: He is alert and oriented to person, place, and time.     Cranial Nerves: No cranial nerve deficit.     Deep Tendon Reflexes: Reflexes are normal and symmetric.     Wt Readings from Last 3 Encounters:  07/15/19 265 lb (120.2 kg)  01/13/19 283 lb (128.4 kg)  09/16/18 281 lb (127.5 kg)    BP 120/80   Pulse 76   Ht 6\' 1"  (1.854 m)   Wt 265 lb (120.2 kg)   BMI 34.96 kg/m   Assessment and Plan: 1. Essential hypertension Chronic.  Controlled.  Continue hydrochlorothiazide 12.5 mg once a day and lisinopril-hydrochlorothiazide 10-12 0.5 once a day.  Will check comprehensive metabolic  panel. - Comprehensive Metabolic Panel (CMET) - hydrochlorothiazide (MICROZIDE) 12.5 MG capsule; Take 1 capsule (12.5 mg total) by mouth daily.  Dispense: 90 capsule; Refill: 1 - lisinopril-hydrochlorothiazide (ZESTORETIC) 10-12.5 MG tablet; TAKE 1 TABLET BY MOUTH EVERY DAY  Dispense: 90 tablet; Refill: 1  2. Eczema, unspecified type Chronic.  Controlled.  Patient may continue Lotrisone cream twice daily. - clotrimazole-betamethasone (LOTRISONE) cream; Apply 1 application topically daily.  Dispense: 30 g; Refill: 3  3. Nocturia Patient has occasional nocturia for which we will check a PSA. - PSA  4. Annual physical exam No subjective/objective concerns noted during the physical and history.  Patient's previous encounters were reviewed as were the labs.Adam Olson is a 54 y.o. male who presents today for his Complete Annual Exam. He feels well. He reports exercising . He reports he is sleeping well.  - Comprehensive Metabolic Panel (CMET) - Lipid Panel With LDL/HDL Ratio  5. Paresthesia of left foot Patient has paresthesia of his left foot which  is consistent with radiculopathy.  Patient has a history of previous surgery.  Will initiate some prednisone 10 mg once a day.  And if this is the continue will refer to orthopedic/neurosurgery. - predniSONE (DELTASONE) 10 MG tablet; Take 1 tablet (10 mg total) by mouth daily with breakfast.  Dispense: 30 tablet; Refill: 0  6. Mixed hyperlipidemia Patient with history of mixed hyperlipidemia which is currently controlled by diet last was improved from the previous will recheck again and if elevated will consider use of a statin. - Lipid Panel With LDL/HDL Ratio  7. Colon cancer screening Colon cancer screen was performed and it was negative for blood. - POCT occult blood stool

## 2019-07-16 LAB — COMPREHENSIVE METABOLIC PANEL
ALT: 159 IU/L — ABNORMAL HIGH (ref 0–44)
AST: 144 IU/L — ABNORMAL HIGH (ref 0–40)
Albumin/Globulin Ratio: 2 (ref 1.2–2.2)
Albumin: 4.9 g/dL (ref 3.8–4.9)
Alkaline Phosphatase: 115 IU/L (ref 39–117)
BUN/Creatinine Ratio: 14 (ref 9–20)
BUN: 16 mg/dL (ref 6–24)
Bilirubin Total: 1 mg/dL (ref 0.0–1.2)
CO2: 21 mmol/L (ref 20–29)
Calcium: 9.8 mg/dL (ref 8.7–10.2)
Chloride: 92 mmol/L — ABNORMAL LOW (ref 96–106)
Creatinine, Ser: 1.12 mg/dL (ref 0.76–1.27)
GFR calc Af Amer: 87 mL/min/{1.73_m2} (ref >59)
GFR calc non Af Amer: 75 mL/min/{1.73_m2} (ref >59)
Globulin, Total: 2.4 g/dL (ref 1.5–4.5)
Glucose: 112 mg/dL — ABNORMAL HIGH (ref 65–99)
Potassium: 5.2 mmol/L (ref 3.5–5.2)
Sodium: 136 mmol/L (ref 134–144)
Total Protein: 7.3 g/dL (ref 6.0–8.5)

## 2019-07-16 LAB — PSA: Prostate Specific Ag, Serum: 0.8 ng/mL (ref 0.0–4.0)

## 2019-07-16 LAB — LIPID PANEL WITH LDL/HDL RATIO
Cholesterol, Total: 226 mg/dL — ABNORMAL HIGH (ref 100–199)
HDL: 35 mg/dL — ABNORMAL LOW (ref >39)
LDL Calculated: 158 mg/dL — ABNORMAL HIGH (ref 0–99)
LDl/HDL Ratio: 4.5 ratio — ABNORMAL HIGH (ref 0.0–3.6)
Triglycerides: 165 mg/dL — ABNORMAL HIGH (ref 0–149)
VLDL Cholesterol Cal: 33 mg/dL (ref 5–40)

## 2019-08-07 DIAGNOSIS — G4733 Obstructive sleep apnea (adult) (pediatric): Secondary | ICD-10-CM | POA: Diagnosis not present

## 2019-09-07 DIAGNOSIS — G4733 Obstructive sleep apnea (adult) (pediatric): Secondary | ICD-10-CM | POA: Diagnosis not present

## 2019-10-07 DIAGNOSIS — G4733 Obstructive sleep apnea (adult) (pediatric): Secondary | ICD-10-CM | POA: Diagnosis not present

## 2019-12-30 ENCOUNTER — Other Ambulatory Visit: Payer: Self-pay | Admitting: Family Medicine

## 2019-12-30 DIAGNOSIS — I10 Essential (primary) hypertension: Secondary | ICD-10-CM

## 2019-12-30 DIAGNOSIS — R202 Paresthesia of skin: Secondary | ICD-10-CM

## 2020-01-06 ENCOUNTER — Encounter: Payer: Self-pay | Admitting: Family Medicine

## 2020-01-06 ENCOUNTER — Ambulatory Visit (INDEPENDENT_AMBULATORY_CARE_PROVIDER_SITE_OTHER): Payer: BC Managed Care – PPO | Admitting: Family Medicine

## 2020-01-06 ENCOUNTER — Other Ambulatory Visit: Payer: Self-pay

## 2020-01-06 VITALS — BP 140/80 | HR 84 | Ht 73.0 in | Wt 266.0 lb

## 2020-01-06 DIAGNOSIS — I10 Essential (primary) hypertension: Secondary | ICD-10-CM | POA: Diagnosis not present

## 2020-01-06 DIAGNOSIS — N529 Male erectile dysfunction, unspecified: Secondary | ICD-10-CM | POA: Diagnosis not present

## 2020-01-06 DIAGNOSIS — Z23 Encounter for immunization: Secondary | ICD-10-CM

## 2020-01-06 DIAGNOSIS — E782 Mixed hyperlipidemia: Secondary | ICD-10-CM

## 2020-01-06 MED ORDER — SILDENAFIL CITRATE 100 MG PO TABS: 100.0000 mg | ORAL_TABLET | Freq: Every day | ORAL | 11 refills | Status: DC | PRN

## 2020-01-06 MED ORDER — LISINOPRIL-HYDROCHLOROTHIAZIDE 10-12.5 MG PO TABS: 1.0000 | ORAL_TABLET | Freq: Every day | ORAL | 1 refills | Status: DC

## 2020-01-06 MED ORDER — HYDROCHLOROTHIAZIDE 12.5 MG PO CAPS: ORAL_CAPSULE | ORAL | 1 refills | Status: DC

## 2020-01-06 NOTE — Progress Notes (Signed)
Date:  01/06/2020   Name:  Adam Olson   DOB:  June 18, 1966   MRN:  [id number]   Chief Complaint: Hypertension and Flu Vaccine  Hypertension This is a chronic problem. The current episode started more than 1 year ago. The problem has been gradually improving since onset. The problem is controlled. Pertinent negatives include no anxiety, blurred vision, chest pain, headaches, malaise/fatigue, neck pain, orthopnea, palpitations, peripheral edema, PND, shortness of breath or sweats. There are no associated agents to hypertension. Risk factors for coronary artery disease include dyslipidemia and male gender. Past treatments include ACE inhibitors and diuretics. The current treatment provides moderate improvement. There are no compliance problems.  There is no history of angina, kidney disease, CAD/MI, CVA, heart failure, left ventricular hypertrophy, PVD or retinopathy. There is no history of chronic renal disease, a hypertension causing med or renovascular disease.  Hyperlipidemia This is a chronic problem. The current episode started more than 1 year ago. The problem is controlled. Recent lipid tests were reviewed and are normal. He has no history of chronic renal disease, diabetes, hypothyroidism, liver disease, obesity or nephrotic syndrome. Factors aggravating his hyperlipidemia include thiazides. Pertinent negatives include no chest pain, focal sensory loss, focal weakness, leg pain, myalgias or shortness of breath. Current antihyperlipidemic treatment includes diet change. The current treatment provides mild improvement of lipids. There are no compliance problems.  Risk factors for coronary artery disease include hypertension.  Erectile Dysfunction This is a chronic problem. The current episode started more than 1 month ago. The problem has been waxing and waning since onset. The nature of his difficulty is penetration, maintaining erection and achieving erection. He reports no anxiety,  decreased libido or performance anxiety. Irritative symptoms do not include frequency, nocturia or urgency. Obstructive symptoms do not include incomplete emptying. Pertinent negatives include no chills, dysuria or hematuria. Nothing aggravates the symptoms.    Lab Results  Component Value Date   CREATININE 1.12 07/15/2019   BUN 16 07/15/2019   NA 136 07/15/2019   K 5.2 07/15/2019   CL 92 (L) 07/15/2019   CO2 21 07/15/2019   Lab Results  Component Value Date   CHOL 226 (H) 07/15/2019   HDL 35 (L) 07/15/2019   LDLCALC 158 (H) 07/15/2019   TRIG 165 (H) 07/15/2019   CHOLHDL 6.3 (H) 05/22/2016   No results found for: TSH No results found for: HGBA1C   Review of Systems  Constitutional: Negative for chills, fever and malaise/fatigue.  HENT: Negative for drooling, ear discharge, ear pain and sore throat.   Eyes: Negative for blurred vision.  Respiratory: Negative for cough, shortness of breath and wheezing.   Cardiovascular: Negative for chest pain, palpitations, orthopnea, leg swelling and PND.  Gastrointestinal: Negative for abdominal pain, blood in stool, constipation, diarrhea and nausea.  Endocrine: Negative for polydipsia.  Genitourinary: Negative for decreased libido, dysuria, frequency, hematuria, incomplete emptying, nocturia and urgency.  Musculoskeletal: Negative for back pain, myalgias and neck pain.  Skin: Negative for rash.  Allergic/Immunologic: Negative for environmental allergies.  Neurological: Negative for dizziness, focal weakness and headaches.  Hematological: Does not bruise/bleed easily.  Psychiatric/Behavioral: Negative for suicidal ideas. The patient is not nervous/anxious.     Patient Active Problem List   Diagnosis Date Noted  . Routine general medical examination at a health care facility 05/02/2015  . Dermatitis, eczematoid 05/02/2015  . Blood pressure elevated 05/02/2015  . Adiposity 05/02/2015    No Known Allergies  Past Surgical History:  Procedure Laterality Date  . LIPOMA EXCISION     scapula  . RECONSTRUCTION OF EYELID      Social History   Tobacco Use  . Smoking status: Current Every Day Smoker    Types: Cigars  . Smokeless tobacco: Never Used  Substance Use Topics  . Alcohol use: Yes    Alcohol/week: 0.0 standard drinks  . Drug use: No     Medication list has been reviewed and updated.  Current Meds  Medication Sig  . clotrimazole-betamethasone (LOTRISONE) cream Apply 1 application topically daily.  . hydrochlorothiazide (MICROZIDE) 12.5 MG capsule TAKE 1 CAPSULE BY MOUTH EVERY DAY  . lisinopril-hydrochlorothiazide (ZESTORETIC) 10-12.5 MG tablet TAKE 1 TABLET BY MOUTH EVERY DAY  . [DISCONTINUED] predniSONE (DELTASONE) 10 MG tablet Take 1 tablet (10 mg total) by mouth daily with breakfast.    PHQ 2/9 Scores 01/06/2020 07/15/2019 02/26/2018 05/22/2016  PHQ - 2 Score 0 0 0 0  PHQ- 9 Score 0 0 0 -    BP Readings from Last 3 Encounters:  01/06/20 140/80  07/15/19 120/80  01/13/19 122/80    Physical Exam Vitals and nursing note reviewed.  HENT:     Head: Normocephalic.     Right Ear: Tympanic membrane, ear canal and external ear normal.     Left Ear: Tympanic membrane, ear canal and external ear normal.     Nose: Nose normal. No congestion or rhinorrhea.  Eyes:     General: No scleral icterus.       Right eye: No discharge.        Left eye: No discharge.     Conjunctiva/sclera: Conjunctivae normal.     Pupils: Pupils are equal, round, and reactive to light.  Neck:     Thyroid: No thyromegaly.     Vascular: No JVD.     Trachea: No tracheal deviation.  Cardiovascular:     Rate and Rhythm: Normal rate and regular rhythm.     Heart sounds: Normal heart sounds. No murmur. No friction rub. No gallop.   Pulmonary:     Effort: No respiratory distress.     Breath sounds: Normal breath sounds. No wheezing or rales.  Abdominal:     General: Bowel sounds are normal.     Palpations: Abdomen is soft.  There is no mass.     Tenderness: There is no abdominal tenderness. There is no guarding or rebound.  Genitourinary:    Penis: Normal.      Testes: Normal.        Right: Mass or tenderness not present.        Left: Mass or tenderness not present.  Musculoskeletal:        General: No tenderness. Normal range of motion.     Cervical back: Normal range of motion and neck supple.  Lymphadenopathy:     Cervical: No cervical adenopathy.  Skin:    General: Skin is warm.     Findings: No rash.  Neurological:     Mental Status: He is alert and oriented to person, place, and time.     Cranial Nerves: No cranial nerve deficit.     Deep Tendon Reflexes: Reflexes are normal and symmetric.     Wt Readings from Last 3 Encounters:  01/06/20 266 lb (120.7 kg)  07/15/19 265 lb (120.2 kg)  01/13/19 283 lb (128.4 kg)    BP 140/80   Pulse 84   Ht 6\' 1"  (1.854 m)   Wt 266 lb (120.7 kg)  BMI 35.09 kg/m   Assessment and Plan:  1. Essential hypertension Chronic.  Controlled.  Stable.  We will continue lisinopril hydrochlorothiazide 10-12 0.5 as well as an additional 12.5 mg hydrochlorothiazide.  We will check a renal function panel to assess GFR. - hydrochlorothiazide (MICROZIDE) 12.5 MG capsule; TAKE 1 CAPSULE BY MOUTH EVERY DAY  Dispense: 90 capsule; Refill: 1 - lisinopril-hydrochlorothiazide (ZESTORETIC) 10-12.5 MG tablet; Take 1 tablet by mouth daily.  Dispense: 90 tablet; Refill: 1 - Renal Function Panel  2. Moderate mixed hyperlipidemia not requiring statin therapy Patient's had elevated readings in triglycerides and LDL total cholesterol.  Patient has not lost weight and we will check a lipid panel with the possibility of initiating a statin agent. - Lipid Panel With LDL/HDL Ratio  3. Vasculogenic erectile dysfunction, unspecified vasculogenic erectile dysfunction type New onset.  Episodic.  Patient is noted the inability to initiate and achieve erection over the past couple of months.   This is of a rather abrupt nature.  We will in addition to checking patient's testosterone both total and free, we will initiate a trial of sildenafil 100 mg 1/2-1.  Patient will inform us as to the efficacy of this or if he should have any untoward effects. - Testosterone,Free and Total  4. Influenza vaccine needed Discussed and administered - Flu Vaccine QUAD 6+ mos PF IM (Fluarix Quad PF)

## 2020-01-08 LAB — LIPID PANEL WITH LDL/HDL RATIO
Cholesterol, Total: 220 mg/dL — ABNORMAL HIGH (ref 100–199)
HDL: 32 mg/dL — ABNORMAL LOW (ref >39)
LDL Chol Calc (NIH): 161 mg/dL — ABNORMAL HIGH (ref 0–99)
LDL/HDL Ratio: 5 ratio — ABNORMAL HIGH (ref 0.0–3.6)
Triglycerides: 146 mg/dL (ref 0–149)
VLDL Cholesterol Cal: 27 mg/dL (ref 5–40)

## 2020-01-08 LAB — RENAL FUNCTION PANEL
Albumin: 4.6 g/dL (ref 3.8–4.9)
BUN/Creatinine Ratio: 10 (ref 9–20)
BUN: 10 mg/dL (ref 6–24)
CO2: 25 mmol/L (ref 20–29)
Calcium: 9.6 mg/dL (ref 8.7–10.2)
Chloride: 96 mmol/L (ref 96–106)
Creatinine, Ser: 1.02 mg/dL (ref 0.76–1.27)
GFR calc Af Amer: 97 mL/min/{1.73_m2} (ref >59)
GFR calc non Af Amer: 84 mL/min/{1.73_m2} (ref >59)
Glucose: 138 mg/dL — ABNORMAL HIGH (ref 65–99)
Phosphorus: 3.7 mg/dL (ref 2.8–4.1)
Potassium: 4.2 mmol/L (ref 3.5–5.2)
Sodium: 139 mmol/L (ref 134–144)

## 2020-01-08 LAB — TESTOSTERONE,FREE AND TOTAL
Testosterone, Free: 10.6 pg/mL (ref 7.2–24.0)
Testosterone: 197 ng/dL — ABNORMAL LOW (ref 264–916)

## 2020-01-12 ENCOUNTER — Other Ambulatory Visit: Payer: Self-pay

## 2020-01-12 DIAGNOSIS — E291 Testicular hypofunction: Secondary | ICD-10-CM

## 2020-01-12 NOTE — Progress Notes (Signed)
Ref uro put in

## 2020-02-09 ENCOUNTER — Ambulatory Visit: Payer: BC Managed Care – PPO | Admitting: Urology

## 2020-02-24 ENCOUNTER — Other Ambulatory Visit: Payer: Self-pay

## 2020-02-24 ENCOUNTER — Encounter: Payer: Self-pay | Admitting: Urology

## 2020-02-24 ENCOUNTER — Ambulatory Visit (INDEPENDENT_AMBULATORY_CARE_PROVIDER_SITE_OTHER): Payer: BC Managed Care – PPO | Admitting: Urology

## 2020-02-24 VITALS — BP 160/95 | HR 116 | Ht 73.0 in | Wt 279.5 lb

## 2020-02-24 DIAGNOSIS — R7989 Other specified abnormal findings of blood chemistry: Secondary | ICD-10-CM

## 2020-02-24 DIAGNOSIS — N529 Male erectile dysfunction, unspecified: Secondary | ICD-10-CM

## 2020-02-24 MED ORDER — TADALAFIL 5 MG PO TABS: 5.0000 mg | ORAL_TABLET | Freq: Every day | ORAL | 11 refills | Status: DC | PRN

## 2020-02-24 NOTE — Progress Notes (Signed)
   02/24/20 8:59 AM   Adam Olson 13-May-1966 [id number]  CC: Low T, ED  HPI: I saw Adam Olson in urology clinic for evaluation of low testosterone and erectile dysfunction.  He is a healthy 54 year old male with past medical history notable for hypertension and back surgery who reports new onset of erectile dysfunction over the last few months.  He denies any decreased sex drive, low energy, fatigue, depression, or weight gain.  Overall, he feels well aside from the erectile dysfunction.  He has previously tried Viagra and Cialis in the past.  Viagra gave him a significant headache that was bothersome, however he tolerated Cialis well.  He denies any family history of prostate cancer.  Recent PSA was normal at 0.8.  Testosterone was low at 197.  He denies any urinary symptoms or gross hematuria.   PMH: Past Medical History:  Diagnosis Date  . Hypertension     Surgical History: Past Surgical History:  Procedure Laterality Date  . LIPOMA EXCISION     scapula  . RECONSTRUCTION OF EYELID      Family History: Family History  Problem Relation Age of Onset  . Cancer Maternal Grandmother   . Cancer Maternal Grandfather     Social History:  reports that he has been smoking cigars. He has never used smokeless tobacco. He reports current alcohol use. He reports that he does not use drugs.  Physical Exam: BP (!) 160/95 (BP Location: Left Arm, Patient Position: Sitting, Cuff Size: Large)   Pulse (!) 116   Ht 6\' 1"  (1.854 m)   Wt 279 lb 8 oz (126.8 kg)   BMI 36.88 kg/m    Constitutional:  Alert and oriented, No acute distress.  Laboratory Data: Reviewed, see HPI  Pertinent Imaging: None to review  Assessment & Plan:   In summary, he is a healthy 54 year old male with new onset erectile dysfunction, as well as a low testosterone on lab work.  He denies any symptoms of low testosterone, and is primarily concerned with his erections.  We discussed at length the different PDE  5 inhibitors in the pros/cons of Viagra for Cialis.  We discussed the risks and benefits at length.  He would like to hold off on any testosterone replacement at this time, and re-trial Cialis for his ED.  Trial of Cialis 5 mg on demand RTC 1 year for symptom check  45, MD 02/24/2020  [facility] [address], [address] [place], [place] [phone]

## 2020-02-24 NOTE — Patient Instructions (Signed)
Tadalafil tablets (Cialis) What is this medicine? TADALAFIL (tah DA la fil) is used to treat erection problems in men. It is also used for enlargement of the prostate gland in men, a condition called benign prostatic hyperplasia or BPH. This medicine improves urine flow and reduces BPH symptoms. This medicine can also treat both erection problems and BPH when they occur together. This medicine may be used for other purposes; ask your health care provider or pharmacist if you have questions. COMMON BRAND NAME(S): Adcirca, ALYQ, Cialis What should I tell my health care provider before I take this medicine? They need to know if you have any of these conditions:  bleeding disorders  eye or vision problems, including a rare inherited eye disease called retinitis pigmentosa  anatomical deformation of the penis, Peyronie's disease, or history of priapism (painful and prolonged erection)  heart disease, angina, a history of heart attack, irregular heart beats, or other heart problems  high or low blood pressure  history of blood diseases, like sickle cell anemia or leukemia  history of stomach bleeding  kidney disease  liver disease  stroke  an unusual or allergic reaction to tadalafil, other medicines, foods, dyes, or preservatives  pregnant or trying to get pregnant  breast-feeding How should I use this medicine? Take this medicine by mouth with a glass of water. Follow the directions on the prescription label. You may take this medicine with or without meals. When this medicine is used for erection problems, your doctor may prescribe it to be taken once daily or as needed. If you are taking the medicine as needed, you may be able to have sexual activity 30 minutes after taking it and for up to 36 hours after taking it. Whether you are taking the medicine as needed or once daily, you should not take more than one dose per day. If you are taking this medicine for symptoms of benign  prostatic hyperplasia (BPH) or to treat both BPH and an erection problem, take the dose once daily at about the same time each day. Do not take your medicine more often than directed. Talk to your pediatrician regarding the use of this medicine in children. Special care may be needed. Overdosage: If you think you have taken too much of this medicine contact a poison control center or emergency room at once. NOTE: This medicine is only for you. Do not share this medicine with others. What if I miss a dose? If you are taking this medicine as needed for erection problems, this does not apply. If you miss a dose while taking this medicine once daily for an erection problem, benign prostatic hyperplasia, or both, take it as soon as you remember, but do not take more than one dose per day. What may interact with this medicine? Do not take this medicine with any of the following medications:  nitrates like amyl nitrite, isosorbide dinitrate, isosorbide mononitrate, nitroglycerin  other medicines for erectile dysfunction like avanafil, sildenafil, vardenafil  other tadalafil products (Adcirca)  riociguat This medicine may also interact with the following medications:  certain drugs for high blood pressure  certain drugs for the treatment of HIV infection or AIDS  certain drugs used for fungal or yeast infections, like fluconazole, itraconazole, ketoconazole, and voriconazole  certain drugs used for seizures like carbamazepine, phenytoin, and phenobarbital  grapefruit juice  macrolide antibiotics like clarithromycin, erythromycin, troleandomycin  medicines for prostate problems  rifabutin, rifampin or rifapentine This list may not describe all possible interactions. Give your   health care provider a list of all the medicines, herbs, non-prescription drugs, or dietary supplements you use. Also tell them if you smoke, drink alcohol, or use illegal drugs. Some items may interact with your  medicine. What should I watch for while using this medicine? If you notice any changes in your vision while taking this drug, call your doctor or health care professional as soon as possible. Stop using this medicine and call your health care provider right away if you have a loss of sight in one or both eyes. Contact your doctor or health care professional right away if the erection lasts longer than 4 hours or if it becomes painful. This may be a sign of serious problem and must be treated right away to prevent permanent damage. If you experience symptoms of nausea, dizziness, chest pain or arm pain upon initiation of sexual activity after taking this medicine, you should refrain from further activity and call your doctor or health care professional as soon as possible. Do not drink alcohol to excess (examples, 5 glasses of wine or 5 shots of whiskey) when taking this medicine. When taken in excess, alcohol can increase your chances of getting a headache or getting dizzy, increasing your heart rate or lowering your blood pressure. Using this medicine does not protect you or your partner against HIV infection (the virus that causes AIDS) or other sexually transmitted diseases. What side effects may I notice from receiving this medicine? Side effects that you should report to your doctor or health care professional as soon as possible:  allergic reactions like skin rash, itching or hives, swelling of the face, lips, or tongue  breathing problems  changes in hearing  changes in vision  chest pain  fast, irregular heartbeat  prolonged or painful erection  seizures Side effects that usually do not require medical attention (report to your doctor or health care professional if they continue or are bothersome):  back pain  dizziness  flushing  headache  indigestion  muscle aches  nausea  stuffy or runny nose This list may not describe all possible side effects. Call your doctor  for medical advice about side effects. You may report side effects to FDA at 1-800-FDA-1088. Where should I keep my medicine? Keep out of the reach of children. Store at room temperature between 15 and 30 degrees C (59 and 86 degrees F). Throw away any unused medicine after the expiration date. NOTE: This sheet is a summary. It may not cover all possible information. If you have questions about this medicine, talk to your doctor, pharmacist, or health care provider.  2020 [employer]vier/Gold Standard (2014-04-23 13:15:49)  

## 2020-04-21 DIAGNOSIS — G4733 Obstructive sleep apnea (adult) (pediatric): Secondary | ICD-10-CM | POA: Diagnosis not present

## 2020-05-17 ENCOUNTER — Other Ambulatory Visit: Payer: Self-pay | Admitting: Family Medicine

## 2020-05-17 DIAGNOSIS — I10 Essential (primary) hypertension: Secondary | ICD-10-CM

## 2020-07-19 ENCOUNTER — Other Ambulatory Visit: Payer: Self-pay | Admitting: Family Medicine

## 2020-07-19 DIAGNOSIS — L309 Dermatitis, unspecified: Secondary | ICD-10-CM

## 2020-09-16 ENCOUNTER — Other Ambulatory Visit: Payer: Self-pay | Admitting: Family Medicine

## 2020-09-16 DIAGNOSIS — I10 Essential (primary) hypertension: Secondary | ICD-10-CM

## 2020-09-16 NOTE — Telephone Encounter (Signed)
Requested  medications are  due for refill today yes  Requested medications are on the active medication list yes  Last refill 7/5  Last visit Jan 2021  Future visit scheduled no  Notes to clinic Failed protocol of visit within 6 months

## 2020-09-30 ENCOUNTER — Encounter: Payer: Self-pay | Admitting: Family Medicine

## 2020-09-30 ENCOUNTER — Other Ambulatory Visit: Payer: Self-pay

## 2020-09-30 ENCOUNTER — Ambulatory Visit (INDEPENDENT_AMBULATORY_CARE_PROVIDER_SITE_OTHER): Payer: BC Managed Care – PPO | Admitting: Family Medicine

## 2020-09-30 VITALS — BP 130/80 | HR 80 | Ht 73.0 in | Wt 281.0 lb

## 2020-09-30 DIAGNOSIS — Z23 Encounter for immunization: Secondary | ICD-10-CM | POA: Diagnosis not present

## 2020-09-30 DIAGNOSIS — Z0000 Encounter for general adult medical examination without abnormal findings: Secondary | ICD-10-CM | POA: Diagnosis not present

## 2020-09-30 DIAGNOSIS — F17210 Nicotine dependence, cigarettes, uncomplicated: Secondary | ICD-10-CM | POA: Diagnosis not present

## 2020-09-30 DIAGNOSIS — E782 Mixed hyperlipidemia: Secondary | ICD-10-CM

## 2020-09-30 DIAGNOSIS — Z1211 Encounter for screening for malignant neoplasm of colon: Secondary | ICD-10-CM

## 2020-09-30 DIAGNOSIS — I10 Essential (primary) hypertension: Secondary | ICD-10-CM | POA: Diagnosis not present

## 2020-09-30 DIAGNOSIS — R351 Nocturia: Secondary | ICD-10-CM | POA: Diagnosis not present

## 2020-09-30 LAB — HEMOCCULT GUIAC POC 1CARD (OFFICE): Fecal Occult Blood, POC: NEGATIVE

## 2020-09-30 MED ORDER — HYDROCHLOROTHIAZIDE 12.5 MG PO CAPS: 12.5000 mg | ORAL_CAPSULE | Freq: Every day | ORAL | 1 refills | Status: DC

## 2020-09-30 MED ORDER — LISINOPRIL-HYDROCHLOROTHIAZIDE 10-12.5 MG PO TABS: 1.0000 | ORAL_TABLET | Freq: Every day | ORAL | 1 refills | Status: DC

## 2020-09-30 NOTE — Progress Notes (Signed)
Date:  09/30/2020   Name:  Adam Olson   DOB:  Nov 06, 1966   MRN:  [id number]   Chief Complaint: Annual Exam, Hypertension, and Flu Vaccine  Patient is a 54 year old male who presents for a comprehensive physical exam. The patient reports the following problems: hypertension. Health maintenance has been reviewed up to date.  Hypertension This is a chronic problem. The current episode started more than 1 year ago. The problem has been gradually improving since onset. The problem is controlled. Pertinent negatives include no anxiety, blurred vision, chest pain, headaches, malaise/fatigue, neck pain, orthopnea, palpitations, peripheral edema, PND, shortness of breath or sweats. There are no associated agents to hypertension. There are no known risk factors for coronary artery disease. Past treatments include ACE inhibitors and diuretics. The current treatment provides moderate improvement. There are no compliance problems.  There is no history of angina, kidney disease, CAD/MI, CVA, heart failure, left ventricular hypertrophy, PVD or retinopathy. There is no history of chronic renal disease, a hypertension causing med or renovascular disease.    Lab Results  Component Value Date   CREATININE 1.02 01/06/2020   BUN 10 01/06/2020   NA 139 01/06/2020   K 4.2 01/06/2020   CL 96 01/06/2020   CO2 25 01/06/2020   Lab Results  Component Value Date   CHOL 220 (H) 01/06/2020   HDL 32 (L) 01/06/2020   LDLCALC 161 (H) 01/06/2020   TRIG 146 01/06/2020   CHOLHDL 6.3 (H) 05/22/2016   No results found for: TSH No results found for: HGBA1C No results found for: WBC, HGB, HCT, MCV, PLT Lab Results  Component Value Date   ALT 159 (H) 07/15/2019   AST 144 (H) 07/15/2019   ALKPHOS 115 07/15/2019   BILITOT 1.0 07/15/2019     Review of Systems  Constitutional: Negative for appetite change, chills, fatigue, fever, malaise/fatigue and unexpected weight change.  HENT: Negative for drooling, ear  discharge, ear pain, facial swelling, hearing loss, nosebleeds, sneezing, sore throat and trouble swallowing.   Eyes: Negative for blurred vision, photophobia, pain, discharge, redness, itching and visual disturbance.  Respiratory: Negative for cough, choking, chest tightness, shortness of breath and wheezing.   Cardiovascular: Negative for chest pain, palpitations, orthopnea, leg swelling and PND.  Gastrointestinal: Negative for abdominal pain, blood in stool, constipation, diarrhea, nausea, rectal pain and vomiting.  Endocrine: Negative for cold intolerance, heat intolerance, polydipsia, polyphagia and polyuria.  Genitourinary: Negative for decreased urine volume, difficulty urinating, discharge, dysuria, flank pain, frequency, hematuria, penile pain, penile swelling, scrotal swelling, testicular pain and urgency.  Musculoskeletal: Negative for back pain, joint swelling, myalgias, neck pain and neck stiffness.  Skin: Negative for color change and rash.  Allergic/Immunologic: Negative for environmental allergies and immunocompromised state.  Neurological: Negative for dizziness, tremors, seizures, syncope, speech difficulty, weakness, light-headedness, numbness and headaches.  Hematological: Does not bruise/bleed easily.  Psychiatric/Behavioral: Negative for agitation, behavioral problems, confusion, dysphoric mood, hallucinations, self-injury and suicidal ideas. The patient is not nervous/anxious.     Patient Active Problem List   Diagnosis Date Noted  . Routine general medical examination at a health care facility 05/02/2015  . Dermatitis, eczematoid 05/02/2015  . Blood pressure elevated 05/02/2015  . Adiposity 05/02/2015    No Known Allergies  Past Surgical History:  Procedure Laterality Date  . LIPOMA EXCISION     scapula  . RECONSTRUCTION OF EYELID      Social History   Tobacco Use  . Smoking status: Current Every  Day Smoker    Types: Cigars  . Smokeless tobacco: Never Used   Substance Use Topics  . Alcohol use: Yes    Alcohol/week: 0.0 standard drinks  . Drug use: No     Medication list has been reviewed and updated.  Current Meds  Medication Sig  . clotrimazole-betamethasone (LOTRISONE) cream APPLY TO AFFECTED AREA EVERY DAY  . hydrochlorothiazide (MICROZIDE) 12.5 MG capsule TAKE 1 CAPSULE BY MOUTH EVERY DAY  . lisinopril-hydrochlorothiazide (ZESTORETIC) 10-12.5 MG tablet TAKE 1 TABLET BY MOUTH EVERY DAY  . tadalafil (CIALIS) 5 MG tablet Take 1 tablet (5 mg total) by mouth daily as needed for erectile dysfunction.    PHQ 2/9 Scores 09/30/2020 01/06/2020 07/15/2019 02/26/2018  PHQ - 2 Score 0 0 0 0  PHQ- 9 Score 0 0 0 0    GAD 7 : Generalized Anxiety Score 09/30/2020  Nervous, Anxious, on Edge 0  Control/stop worrying 0  Worry too much - different things 0  Trouble relaxing 0  Restless 0  Easily annoyed or irritable 0  Afraid - awful might happen 0  Total GAD 7 Score 0    BP Readings from Last 3 Encounters:  09/30/20 130/80  02/24/20 (!) 160/95  01/06/20 140/80    Physical Exam Vitals and nursing note reviewed.  Constitutional:      Appearance: He is well-developed and well-groomed. He is obese.  HENT:     Head: Normocephalic.     Jaw: There is normal jaw occlusion.     Right Ear: Hearing, tympanic membrane, ear canal and external ear normal.     Left Ear: Hearing, tympanic membrane, ear canal and external ear normal.     Nose: Nose normal.     Mouth/Throat:     Lips: Pink.     Mouth: Mucous membranes are dry.     Tongue: No lesions.     Palate: No mass.     Pharynx: Oropharynx is clear. Uvula midline.  Eyes:     General: Lids are normal. Vision grossly intact. Gaze aligned appropriately.     Extraocular Movements: Extraocular movements intact.     Right eye: Normal extraocular motion.     Left eye: Normal extraocular motion.     Conjunctiva/sclera: Conjunctivae normal.     Pupils: Pupils are equal, round, and reactive to  light.     Funduscopic exam:    Right eye: Red reflex present.        Left eye: Red reflex present.    Comments: Abn iris left/due to trauma as child  Neck:     Thyroid: No thyroid mass, thyromegaly or thyroid tenderness.     Vascular: Normal carotid pulses. No carotid bruit, hepatojugular reflux or JVD.     Trachea: Trachea normal.  Cardiovascular:     Rate and Rhythm: Normal rate and regular rhythm.     Chest Wall: PMI is not displaced. No thrill.     Pulses:          Carotid pulses are 2+ on the right side and 2+ on the left side.      Radial pulses are 2+ on the right side and 2+ on the left side.       Femoral pulses are 2+ on the right side and 2+ on the left side.      Popliteal pulses are 2+ on the right side and 2+ on the left side.       Dorsalis pedis pulses are 1+ on the right  side and 1+ on the left side.       Posterior tibial pulses are 1+ on the right side and 1+ on the left side.     Heart sounds: Normal heart sounds, S1 normal and S2 normal.   No systolic murmur is present.  No diastolic murmur is present.  No friction rub. No gallop. No S3 or S4 sounds.   Pulmonary:     Effort: Pulmonary effort is normal.     Breath sounds: Normal breath sounds. No decreased breath sounds, wheezing, rhonchi or rales.  Chest:     Breasts: Breasts are symmetrical.        Right: Normal.        Left: Normal.  Abdominal:     General: Bowel sounds are normal.     Palpations: Abdomen is soft. There is no hepatomegaly or splenomegaly.     Tenderness: There is no abdominal tenderness. There is no right CVA tenderness or left CVA tenderness.     Hernia: There is no hernia in the left inguinal area or right inguinal area.  Genitourinary:    Penis: Normal and circumcised.      Testes: Normal.     Epididymis:     Right: Normal.     Left: Normal.     Prostate: Normal. Not enlarged, not tender and no nodules present.     Rectum: Normal. Guaiac result negative. No mass.     Comments:  Atrophy testees Musculoskeletal:        General: Normal range of motion.     Cervical back: Normal, normal range of motion and neck supple.     Thoracic back: Normal.     Lumbar back: Normal.     Right lower leg: No edema.     Left lower leg: No edema.  Lymphadenopathy:     Head:     Right side of head: No submental or submandibular adenopathy.     Left side of head: No submental or submandibular adenopathy.     Cervical: No cervical adenopathy.     Right cervical: No superficial, deep or posterior cervical adenopathy.    Left cervical: No superficial, deep or posterior cervical adenopathy.     Upper Body:     Right upper body: No supraclavicular or axillary adenopathy.     Left upper body: No supraclavicular or axillary adenopathy.     Lower Body: No right inguinal adenopathy. No left inguinal adenopathy.  Skin:    General: Skin is warm and dry.     Capillary Refill: Capillary refill takes less than 2 seconds.  Neurological:     General: No focal deficit present.     Mental Status: He is alert and oriented to person, place, and time.     Cranial Nerves: Cranial nerves are intact.     Sensory: Sensation is intact.     Motor: Motor function is intact. No weakness or tremor.     Deep Tendon Reflexes: Reflexes are normal and symmetric.  Psychiatric:        Attention and Perception: Attention and perception normal.        Mood and Affect: Mood normal.        Speech: Speech normal.        Behavior: Behavior normal. Behavior is cooperative.        Thought Content: Thought content normal.        Judgment: Judgment normal.     Wt Readings from Last 3 Encounters:  09/30/20  281 lb (127.5 kg)  02/24/20 279 lb 8 oz (126.8 kg)  01/06/20 266 lb (120.7 kg)    BP 130/80   Pulse 80   Ht 6\' 1"  (1.854 m)   Wt 281 lb (127.5 kg)   BMI 37.07 kg/m   Assessment and Plan:  1. Annual physical exam No subjective/objective concerns noted during history and physical exam. Adam Olson  is a 54 y.o. male who presents today for his Complete Annual Exam. He feels well. He reports exercising . He reports he is sleeping well. Immunizations are reviewed and recommendations provided.   Age appropriate screening tests are discussed. Counseling given for risk factor reduction interventions.  Patient's chart was reviewed for previous imaging, previous labs, and previous encounters.  Will obtain lipid panel, CBC, and CMP. - Lipid Panel With LDL/HDL Ratio - CBC with Differential/Platelet - Comprehensive metabolic panel  2. Essential hypertension Chronic.  Controlled.  Stable.  Blood pressure noted 130/80 continue lisinopril hydrochlorothiazide 10-12 0.5+ an additional 12.5 hydrochlorothiazide.  Will check renal function panel to assess electrolytes and GFR. - hydrochlorothiazide (MICROZIDE) 12.5 MG capsule; Take 1 capsule (12.5 mg total) by mouth daily.  Dispense: 90 capsule; Refill: 1 - lisinopril-hydrochlorothiazide (ZESTORETIC) 10-12.5 MG tablet; Take 1 tablet by mouth daily.  Dispense: 90 tablet; Refill: 1 - Comprehensive metabolic panel  3. Moderate mixed hyperlipidemia not requiring statin therapy 9.  Controlled.  Stable.  Patient has been reemphasize his dietary and will take a look at his LDL for current status and act accordingly. - Lipid Panel With LDL/HDL Ratio  4. Nocturia Chronic.  Controlled.  Stable.  Will check PSA DRE was noted to be normal - PSA  5. Cigarette nicotine dependence without complication Patient has been advised of the health risks of smoking and counseled concerning cessation of tobacco products. I spent over 3 minutes for discussion and to answer questions. - CBC with Differential/Platelet  6. Colon cancer screening Guaiac was negative with normal rectal exam no palpable mass - POCT Occult Blood Stool  7. Need for immunization against influenza Discussed and administered - Flu Vaccine QUAD 36+ mos IM

## 2020-10-01 LAB — LIPID PANEL WITH LDL/HDL RATIO
Cholesterol, Total: 245 mg/dL — ABNORMAL HIGH (ref 100–199)
HDL: 32 mg/dL — ABNORMAL LOW (ref >39)
LDL Chol Calc (NIH): 189 mg/dL — ABNORMAL HIGH (ref 0–99)
LDL/HDL Ratio: 5.9 ratio — ABNORMAL HIGH (ref 0.0–3.6)
Triglycerides: 129 mg/dL (ref 0–149)
VLDL Cholesterol Cal: 24 mg/dL (ref 5–40)

## 2020-10-01 LAB — CBC WITH DIFFERENTIAL/PLATELET
Basophils Absolute: 0 10*3/uL (ref 0.0–0.2)
Basos: 1 %
EOS (ABSOLUTE): 0.1 10*3/uL (ref 0.0–0.4)
Eos: 1 %
Hematocrit: 41.1 % (ref 37.5–51.0)
Hemoglobin: 13.6 g/dL (ref 13.0–17.7)
Immature Grans (Abs): 0 10*3/uL (ref 0.0–0.1)
Immature Granulocytes: 1 %
Lymphocytes Absolute: 1.6 10*3/uL (ref 0.7–3.1)
Lymphs: 20 %
MCH: 27 pg (ref 26.6–33.0)
MCHC: 33.1 g/dL (ref 31.5–35.7)
MCV: 82 fL (ref 79–97)
Monocytes Absolute: 0.9 10*3/uL (ref 0.1–0.9)
Monocytes: 12 %
Neutrophils Absolute: 5.1 10*3/uL (ref 1.4–7.0)
Neutrophils: 65 %
Platelets: 180 10*3/uL (ref 150–450)
RBC: 5.04 x10E6/uL (ref 4.14–5.80)
RDW: 16 % — ABNORMAL HIGH (ref 11.6–15.4)
WBC: 7.8 10*3/uL (ref 3.4–10.8)

## 2020-10-01 LAB — COMPREHENSIVE METABOLIC PANEL WITH GFR
ALT: 40 IU/L (ref 0–44)
AST: 50 IU/L — ABNORMAL HIGH (ref 0–40)
Albumin/Globulin Ratio: 1.7 (ref 1.2–2.2)
Albumin: 4.9 g/dL (ref 3.8–4.9)
Alkaline Phosphatase: 106 IU/L (ref 44–121)
BUN/Creatinine Ratio: 12 (ref 9–20)
BUN: 12 mg/dL (ref 6–24)
Bilirubin Total: 1.1 mg/dL (ref 0.0–1.2)
CO2: 26 mmol/L (ref 20–29)
Calcium: 10 mg/dL (ref 8.7–10.2)
Chloride: 94 mmol/L — ABNORMAL LOW (ref 96–106)
Creatinine, Ser: 1.04 mg/dL (ref 0.76–1.27)
GFR calc Af Amer: 94 mL/min/1.73 (ref >59)
GFR calc non Af Amer: 82 mL/min/1.73 (ref >59)
Globulin, Total: 2.9 g/dL (ref 1.5–4.5)
Glucose: 146 mg/dL — ABNORMAL HIGH (ref 65–99)
Potassium: 5.2 mmol/L (ref 3.5–5.2)
Sodium: 138 mmol/L (ref 134–144)
Total Protein: 7.8 g/dL (ref 6.0–8.5)

## 2020-10-01 LAB — PSA: Prostate Specific Ag, Serum: 0.7 ng/mL (ref 0.0–4.0)

## 2020-10-05 ENCOUNTER — Other Ambulatory Visit: Payer: Self-pay

## 2020-10-05 DIAGNOSIS — E782 Mixed hyperlipidemia: Secondary | ICD-10-CM

## 2020-10-05 MED ORDER — ATORVASTATIN CALCIUM 10 MG PO TABS: 10.0000 mg | ORAL_TABLET | Freq: Every day | ORAL | 1 refills | Status: DC

## 2020-10-05 NOTE — Progress Notes (Unsigned)
Sent in Atorv 

## 2020-10-29 ENCOUNTER — Other Ambulatory Visit: Payer: Self-pay | Admitting: Family Medicine

## 2020-10-29 DIAGNOSIS — E782 Mixed hyperlipidemia: Secondary | ICD-10-CM

## 2020-11-20 ENCOUNTER — Other Ambulatory Visit: Payer: Self-pay | Admitting: Family Medicine

## 2020-11-20 DIAGNOSIS — I10 Essential (primary) hypertension: Secondary | ICD-10-CM

## 2020-11-29 ENCOUNTER — Other Ambulatory Visit: Payer: Self-pay | Admitting: Family Medicine

## 2020-11-29 DIAGNOSIS — E782 Mixed hyperlipidemia: Secondary | ICD-10-CM

## 2020-11-29 NOTE — Telephone Encounter (Signed)
Call to patient- left message that labs are due as follow up for this medication- will send message to PCP to put order in for follow up labs. Lab hours also left on voice mail. Courtesy #[phone] day RF given and message forwarded to provider for orders.

## 2020-12-14 ENCOUNTER — Other Ambulatory Visit: Payer: Self-pay | Admitting: Family Medicine

## 2020-12-14 DIAGNOSIS — I10 Essential (primary) hypertension: Secondary | ICD-10-CM

## 2020-12-24 ENCOUNTER — Other Ambulatory Visit: Payer: Self-pay | Admitting: Family Medicine

## 2020-12-24 DIAGNOSIS — E782 Mixed hyperlipidemia: Secondary | ICD-10-CM

## 2020-12-26 ENCOUNTER — Other Ambulatory Visit: Payer: Self-pay

## 2020-12-26 ENCOUNTER — Other Ambulatory Visit: Payer: BC Managed Care – PPO

## 2020-12-26 DIAGNOSIS — R69 Illness, unspecified: Secondary | ICD-10-CM

## 2020-12-26 DIAGNOSIS — E782 Mixed hyperlipidemia: Secondary | ICD-10-CM | POA: Diagnosis not present

## 2020-12-26 NOTE — Progress Notes (Signed)
Printed lipid and hepatic

## 2020-12-27 ENCOUNTER — Other Ambulatory Visit: Payer: Self-pay

## 2020-12-27 DIAGNOSIS — L309 Dermatitis, unspecified: Secondary | ICD-10-CM

## 2020-12-27 LAB — HEPATIC FUNCTION PANEL
ALT: 30 IU/L (ref 0–44)
AST: 41 IU/L — ABNORMAL HIGH (ref 0–40)
Albumin: 4.6 g/dL (ref 3.8–4.9)
Alkaline Phosphatase: 135 IU/L — ABNORMAL HIGH (ref 44–121)
Bilirubin Total: 0.6 mg/dL (ref 0.0–1.2)
Bilirubin, Direct: 0.29 mg/dL (ref 0.00–0.40)
Total Protein: 7.6 g/dL (ref 6.0–8.5)

## 2020-12-27 LAB — LIPID PANEL WITH LDL/HDL RATIO
Cholesterol, Total: 166 mg/dL (ref 100–199)
HDL: 31 mg/dL — ABNORMAL LOW (ref >39)
LDL Chol Calc (NIH): 113 mg/dL — ABNORMAL HIGH (ref 0–99)
LDL/HDL Ratio: 3.6 ratio (ref 0.0–3.6)
Triglycerides: 121 mg/dL (ref 0–149)
VLDL Cholesterol Cal: 22 mg/dL (ref 5–40)

## 2020-12-27 MED ORDER — CLOTRIMAZOLE-BETAMETHASONE 1-0.05 % EX CREA: TOPICAL_CREAM | CUTANEOUS | 0 refills | Status: DC

## 2021-01-04 ENCOUNTER — Other Ambulatory Visit: Payer: Self-pay | Admitting: Family Medicine

## 2021-01-04 DIAGNOSIS — L309 Dermatitis, unspecified: Secondary | ICD-10-CM

## 2021-01-12 ENCOUNTER — Other Ambulatory Visit: Payer: Self-pay | Admitting: Family Medicine

## 2021-01-12 DIAGNOSIS — I10 Essential (primary) hypertension: Secondary | ICD-10-CM

## 2021-01-12 NOTE — Telephone Encounter (Signed)
Pt is calling in to follow up on refill request for lisinopril-hydrochlorothiazide (ZESTORETIC) 10-12.5 MG tablet. Pt says that he was told by pharmacy to call and request because he is completely out.    Pharmacy:  [id number] [place], [place] - [address] Phone:  [phone]  Fax:  [phone]

## 2021-02-15 DIAGNOSIS — G4733 Obstructive sleep apnea (adult) (pediatric): Secondary | ICD-10-CM | POA: Diagnosis not present

## 2021-02-23 ENCOUNTER — Other Ambulatory Visit: Payer: Self-pay | Admitting: Family Medicine

## 2021-02-23 DIAGNOSIS — I10 Essential (primary) hypertension: Secondary | ICD-10-CM

## 2021-02-23 NOTE — Telephone Encounter (Signed)
Future visit in 4 weeks  

## 2021-03-01 ENCOUNTER — Ambulatory Visit: Payer: Self-pay | Admitting: Urology

## 2021-03-24 ENCOUNTER — Ambulatory Visit: Payer: BC Managed Care – PPO | Admitting: Family Medicine

## 2021-03-29 ENCOUNTER — Other Ambulatory Visit: Payer: Self-pay

## 2021-03-29 ENCOUNTER — Ambulatory Visit (INDEPENDENT_AMBULATORY_CARE_PROVIDER_SITE_OTHER): Payer: BC Managed Care – PPO | Admitting: Family Medicine

## 2021-03-29 ENCOUNTER — Encounter: Payer: Self-pay | Admitting: Family Medicine

## 2021-03-29 VITALS — BP 120/84 | HR 84 | Ht 73.0 in | Wt 284.0 lb

## 2021-03-29 DIAGNOSIS — L309 Dermatitis, unspecified: Secondary | ICD-10-CM | POA: Diagnosis not present

## 2021-03-29 DIAGNOSIS — E782 Mixed hyperlipidemia: Secondary | ICD-10-CM | POA: Diagnosis not present

## 2021-03-29 DIAGNOSIS — I10 Essential (primary) hypertension: Secondary | ICD-10-CM | POA: Diagnosis not present

## 2021-03-29 MED ORDER — LISINOPRIL-HYDROCHLOROTHIAZIDE 10-12.5 MG PO TABS
1.0000 | ORAL_TABLET | Freq: Every day | ORAL | 1 refills | Status: DC
Start: 2021-03-29 — End: 2021-09-11

## 2021-03-29 MED ORDER — CLOTRIMAZOLE-BETAMETHASONE 1-0.05 % EX CREA: TOPICAL_CREAM | CUTANEOUS | 0 refills | Status: DC

## 2021-03-29 MED ORDER — HYDROCHLOROTHIAZIDE 12.5 MG PO CAPS: ORAL_CAPSULE | ORAL | 1 refills | Status: DC

## 2021-03-29 MED ORDER — ATORVASTATIN CALCIUM 10 MG PO TABS: 1.0000 | ORAL_TABLET | Freq: Every day | ORAL | 1 refills | Status: DC

## 2021-03-29 NOTE — Progress Notes (Signed)
Date:  03/29/2021   Name:  Adam Olson   DOB:  [birth year]   MRN:  [id number]   Chief Complaint: Hypertension, Hyperlipidemia, and Rash (Primarily on hands- uses cream as needed)  Hypertension This is a chronic problem. The current episode started more than 1 year ago. The problem has been gradually improving since onset. The problem is controlled. Pertinent negatives include no anxiety, blurred vision, chest pain, headaches, malaise/fatigue, neck pain, orthopnea, palpitations, peripheral edema, PND, shortness of breath or sweats. There are no associated agents to hypertension. There are no known risk factors for coronary artery disease. Past treatments include ACE inhibitors and diuretics. The current treatment provides moderate improvement. There are no compliance problems.  There is no history of angina, kidney disease, CAD/MI, CVA, heart failure, left ventricular hypertrophy, PVD or retinopathy. There is no history of chronic renal disease, a hypertension causing med or renovascular disease.  Hyperlipidemia This is a chronic problem. The current episode started more than 1 year ago. The problem is controlled. Recent lipid tests were reviewed and are normal. He has no history of chronic renal disease, diabetes, hypothyroidism, liver disease, obesity or nephrotic syndrome. Factors aggravating his hyperlipidemia include thiazides. Pertinent negatives include no chest pain, myalgias or shortness of breath. Current antihyperlipidemic treatment includes statins. The current treatment provides moderate improvement of lipids. There are no compliance problems.   Rash This is a chronic problem. The current episode started more than 1 year ago. The problem has been gradually improving since onset. The affected locations include the right hand and left hand. The rash is characterized by redness. Pertinent negatives include no anorexia, congestion, cough, diarrhea, facial edema, fatigue, fever, rhinorrhea,  shortness of breath or sore throat.    Lab Results  Component Value Date   CREATININE 1.04 09/30/2020   BUN 12 09/30/2020   NA 138 09/30/2020   K 5.2 09/30/2020   CL 94 (L) 09/30/2020   CO2 26 09/30/2020   Lab Results  Component Value Date   CHOL 166 12/26/2020   HDL 31 (L) 12/26/2020   LDLCALC 113 (H) 12/26/2020   TRIG 121 12/26/2020   CHOLHDL 6.3 (H) 05/22/2016   No results found for: TSH No results found for: HGBA1C Lab Results  Component Value Date   WBC 7.8 09/30/2020   HGB 13.6 09/30/2020   HCT 41.1 09/30/2020   MCV 82 09/30/2020   PLT 180 09/30/2020   Lab Results  Component Value Date   ALT 30 12/26/2020   AST 41 (H) 12/26/2020   ALKPHOS 135 (H) 12/26/2020   BILITOT 0.6 12/26/2020     Review of Systems  Constitutional: Negative for chills, fatigue, fever and malaise/fatigue.  HENT: Negative for congestion, drooling, ear discharge, ear pain, rhinorrhea and sore throat.   Eyes: Negative for blurred vision.  Respiratory: Negative for cough, shortness of breath and wheezing.   Cardiovascular: Negative for chest pain, palpitations, orthopnea, leg swelling and PND.  Gastrointestinal: Negative for abdominal pain, anorexia, blood in stool, constipation, diarrhea and nausea.  Endocrine: Negative for polydipsia.  Genitourinary: Negative for dysuria, frequency, hematuria and urgency.  Musculoskeletal: Negative for back pain, myalgias and neck pain.  Skin: Positive for rash.  Allergic/Immunologic: Negative for environmental allergies.  Neurological: Negative for dizziness and headaches.  Hematological: Does not bruise/bleed easily.  Psychiatric/Behavioral: Negative for suicidal ideas. The patient is not nervous/anxious.     Patient Active Problem List   Diagnosis Date Noted  . Routine general medical examination at a  health care facility 05/02/2015  . Dermatitis, eczematoid 05/02/2015  . Blood pressure elevated 05/02/2015  . Adiposity 05/02/2015    No Known  Allergies  Past Surgical History:  Procedure Laterality Date  . LIPOMA EXCISION     scapula  . RECONSTRUCTION OF EYELID      Social History   Tobacco Use  . Smoking status: Current Every Day Smoker    Types: Cigars  . Smokeless tobacco: Never Used  Substance Use Topics  . Alcohol use: Yes    Alcohol/week: 0.0 standard drinks  . Drug use: No     Medication list has been reviewed and updated.  Current Meds  Medication Sig  . atorvastatin (LIPITOR) 10 MG tablet TAKE 1 TABLET BY MOUTH EVERY DAY  . clotrimazole-betamethasone (LOTRISONE) cream APPLY TOPICALLY TO AFFECTED AREA EVERY DAY  . hydrochlorothiazide (MICROZIDE) 12.5 MG capsule TAKE 1 CAPSULE BY MOUTH EVERY DAY  . lisinopril-hydrochlorothiazide (ZESTORETIC) 10-12.5 MG tablet TAKE 1 TABLET BY MOUTH EVERY DAY  . tadalafil (CIALIS) 5 MG tablet Take 1 tablet (5 mg total) by mouth daily as needed for erectile dysfunction.    PHQ 2/9 Scores 03/29/2021 09/30/2020 01/06/2020 07/15/2019  PHQ - 2 Score 0 0 0 0  PHQ- 9 Score 0 0 0 0    GAD 7 : Generalized Anxiety Score 03/29/2021 09/30/2020  Nervous, Anxious, on Edge 0 0  Control/stop worrying 0 0  Worry too much - different things 0 0  Trouble relaxing 0 0  Restless 0 0  Easily annoyed or irritable 0 0  Afraid - awful might happen 0 0  Total GAD 7 Score 0 0    BP Readings from Last 3 Encounters:  03/29/21 120/84  09/30/20 130/80  02/24/20 (!) 160/95    Physical Exam Vitals and nursing note reviewed.  HENT:     Head: Normocephalic.     Right Ear: Tympanic membrane, ear canal and external ear normal.     Left Ear: Tympanic membrane, ear canal and external ear normal.     Nose: Nose normal. No congestion or rhinorrhea.     Mouth/Throat:     Mouth: Mucous membranes are moist.  Eyes:     General: No scleral icterus.       Right eye: No discharge.        Left eye: No discharge.     Conjunctiva/sclera: Conjunctivae normal.     Pupils: Pupils are equal, round, and  reactive to light.  Neck:     Thyroid: No thyromegaly.     Vascular: No JVD.     Trachea: No tracheal deviation.  Cardiovascular:     Rate and Rhythm: Normal rate and regular rhythm.     Heart sounds: Normal heart sounds. No murmur heard. No friction rub. No gallop.   Pulmonary:     Effort: No respiratory distress.     Breath sounds: Normal breath sounds. No wheezing, rhonchi or rales.  Abdominal:     General: Bowel sounds are normal.     Palpations: Abdomen is soft. There is no mass.     Tenderness: There is no abdominal tenderness. There is no guarding or rebound.  Musculoskeletal:        General: No tenderness. Normal range of motion.     Cervical back: Normal range of motion and neck supple.  Lymphadenopathy:     Cervical: No cervical adenopathy.  Skin:    General: Skin is warm.     Findings: No rash.  Neurological:  Mental Status: He is alert and oriented to person, place, and time.     Cranial Nerves: No cranial nerve deficit.     Sensory: No sensory deficit.     Deep Tendon Reflexes: Reflexes are normal and symmetric.     Wt Readings from Last 3 Encounters:  03/29/21 284 lb (128.8 kg)  09/30/20 281 lb (127.5 kg)  02/24/20 279 lb 8 oz (126.8 kg)    BP 120/84   Pulse 84   Ht 6\' 1"  (1.854 m)   Wt 284 lb (128.8 kg)   BMI 37.47 kg/m   Assessment and Plan:  1. Essential hypertension Chronic.  Controlled.  Blood pressure today is 120/84.  We will continue hydrochlorothiazide 12.5 mg once a day and lisinopril hydrochlorothiazide 10-12.5 mg once a day.  Review of previous renal function panel is acceptable. - hydrochlorothiazide (MICROZIDE) 12.5 MG capsule; TAKE 1 CAPSULE BY MOUTH EVERY DAY  Dispense: 90 capsule; Refill: 1 - lisinopril-hydrochlorothiazide (ZESTORETIC) 10-12.5 MG tablet; Take 1 tablet by mouth daily.  Dispense: 90 tablet; Refill: 1  2. Mixed hyperlipidemia Chronic.  Controlled.  Stable.  Continue atorvastatin 10 mg once a day.  Will recheck in 6  months. - atorvastatin (LIPITOR) 10 MG tablet; Take 1 tablet (10 mg total) by mouth daily.  Dispense: 90 tablet; Refill: 1  3. Eczema, unspecified type Chronic.  Waxes and wanes.  Stable.  Continue with Lotrisone cream applied on a daily basis as needed for control of eczema. - clotrimazole-betamethasone (LOTRISONE) cream; APPLY TOPICALLY TO AFFECTED AREA EVERY DAY  Dispense: 30 g; Refill: 0

## 2021-04-05 ENCOUNTER — Ambulatory Visit: Payer: BC Managed Care – PPO | Admitting: Family Medicine

## 2021-05-23 ENCOUNTER — Other Ambulatory Visit: Payer: Self-pay | Admitting: Family Medicine

## 2021-05-23 DIAGNOSIS — I10 Essential (primary) hypertension: Secondary | ICD-10-CM

## 2021-05-23 MED ORDER — HYDROCHLOROTHIAZIDE 12.5 MG PO CAPS: ORAL_CAPSULE | ORAL | 0 refills | Status: DC

## 2021-05-23 NOTE — Telephone Encounter (Signed)
Medication Refill - Medication: hydrochlorothiazide (MICROZIDE) 12.5 MG capsule     Preferred Pharmacy (with phone number or street name): [id number] - [place], [place] - [address]  Agent: Please be advised that RX refills may take up to 3 business days. We ask that you follow-up with your pharmacy.

## 2021-08-14 DIAGNOSIS — G4733 Obstructive sleep apnea (adult) (pediatric): Secondary | ICD-10-CM | POA: Diagnosis not present

## 2021-09-11 ENCOUNTER — Encounter: Payer: Self-pay | Admitting: Family Medicine

## 2021-09-11 ENCOUNTER — Other Ambulatory Visit: Payer: Self-pay

## 2021-09-11 ENCOUNTER — Ambulatory Visit (INDEPENDENT_AMBULATORY_CARE_PROVIDER_SITE_OTHER): Payer: BC Managed Care – PPO | Admitting: Family Medicine

## 2021-09-11 VITALS — BP 138/80 | HR 80 | Ht 74.0 in | Wt 271.0 lb

## 2021-09-11 DIAGNOSIS — R739 Hyperglycemia, unspecified: Secondary | ICD-10-CM

## 2021-09-11 DIAGNOSIS — E782 Mixed hyperlipidemia: Secondary | ICD-10-CM

## 2021-09-11 DIAGNOSIS — Z23 Encounter for immunization: Secondary | ICD-10-CM

## 2021-09-11 DIAGNOSIS — I10 Essential (primary) hypertension: Secondary | ICD-10-CM | POA: Diagnosis not present

## 2021-09-11 DIAGNOSIS — L309 Dermatitis, unspecified: Secondary | ICD-10-CM | POA: Diagnosis not present

## 2021-09-11 MED ORDER — LISINOPRIL-HYDROCHLOROTHIAZIDE 10-12.5 MG PO TABS: 1.0000 | ORAL_TABLET | Freq: Every day | ORAL | 1 refills | Status: DC

## 2021-09-11 MED ORDER — CLOTRIMAZOLE-BETAMETHASONE 1-0.05 % EX CREA: TOPICAL_CREAM | CUTANEOUS | 0 refills | Status: DC

## 2021-09-11 MED ORDER — ATORVASTATIN CALCIUM 10 MG PO TABS: 10.0000 mg | ORAL_TABLET | Freq: Every day | ORAL | 1 refills | Status: DC

## 2021-09-11 MED ORDER — HYDROCHLOROTHIAZIDE 12.5 MG PO CAPS: ORAL_CAPSULE | ORAL | 1 refills | Status: DC

## 2021-09-11 NOTE — Progress Notes (Signed)
Date:  09/11/2021   Name:  Adam Olson   DOB:  02-Oct-1966   MRN:  [id number]   Chief Complaint: Hyperlipidemia, Hypertension, and Rash (Uses clotrimazole)  Hyperlipidemia This is a chronic problem. The current episode started more than 1 year ago. Exacerbating diseases include obesity. He has no history of diabetes, hypothyroidism or liver disease. Pertinent negatives include no chest pain, leg pain, myalgias or shortness of breath. Current antihyperlipidemic treatment includes statins. There are no compliance problems.  Risk factors for coronary artery disease include male sex, obesity and dyslipidemia.  Hypertension This is a chronic problem. The current episode started more than 1 year ago. The problem has been resolved since onset. The problem is controlled. Pertinent negatives include no anxiety, blurred vision, chest pain, headaches, neck pain, palpitations, peripheral edema or shortness of breath. Risk factors for coronary artery disease include male gender, obesity and dyslipidemia. Past treatments include ACE inhibitors and diuretics. The current treatment provides mild improvement. There are no compliance problems.   Rash This is a chronic problem. The current episode started more than 1 year ago. The problem has been resolved since onset. The affected locations include the left lower leg. The rash is characterized by dryness. Pertinent negatives include no congestion, cough, diarrhea, facial edema, fatigue, fever, joint pain, shortness of breath or sore throat. The treatment provided significant relief.  Erectile Dysfunction This is a recurrent problem. The current episode started more than 1 month ago. The problem has been gradually worsening since onset. He reports no anxiety. Irritative symptoms do not include frequency, nocturia or urgency. Obstructive symptoms do not include dribbling. Pertinent negatives include no chills, dysuria or hematuria.  Diabetes He presents for his  initial (suspect) diabetic visit. Pertinent negatives for hypoglycemia include no dizziness, headaches or nervousness/anxiousness. Associated symptoms include polydipsia, polyphagia, polyuria and weight loss. Pertinent negatives for diabetes include no blurred vision, no chest pain and no fatigue. Risk factors for coronary artery disease include dyslipidemia, hypertension and male sex. When asked about current treatments, none were reported. He is following a generally unhealthy diet. An ACE inhibitor/angiotensin II receptor blocker is being taken.   Lab Results  Component Value Date   CREATININE 1.04 09/30/2020   BUN 12 09/30/2020   NA 138 09/30/2020   K 5.2 09/30/2020   CL 94 (L) 09/30/2020   CO2 26 09/30/2020   Lab Results  Component Value Date   CHOL 166 12/26/2020   HDL 31 (L) 12/26/2020   LDLCALC 113 (H) 12/26/2020   TRIG 121 12/26/2020   CHOLHDL 6.3 (H) 05/22/2016   No results found for: TSH No results found for: HGBA1C Lab Results  Component Value Date   WBC 7.8 09/30/2020   HGB 13.6 09/30/2020   HCT 41.1 09/30/2020   MCV 82 09/30/2020   PLT 180 09/30/2020   Lab Results  Component Value Date   ALT 30 12/26/2020   AST 41 (H) 12/26/2020   ALKPHOS 135 (H) 12/26/2020   BILITOT 0.6 12/26/2020     Review of Systems  Constitutional:  Positive for weight loss. Negative for chills, fatigue and fever.  HENT:  Negative for congestion, drooling, ear discharge, ear pain and sore throat.   Eyes:  Negative for blurred vision.  Respiratory:  Negative for cough, shortness of breath and wheezing.   Cardiovascular:  Negative for chest pain, palpitations and leg swelling.  Gastrointestinal:  Negative for abdominal pain, blood in stool, constipation, diarrhea and nausea.  Endocrine: Positive for polydipsia,  polyphagia and polyuria.  Genitourinary:  Negative for dysuria, frequency, hematuria, nocturia and urgency.  Musculoskeletal:  Negative for back pain, joint pain, myalgias and  neck pain.  Skin:  Positive for rash.  Allergic/Immunologic: Negative for environmental allergies.  Neurological:  Negative for dizziness and headaches.  Hematological:  Does not bruise/bleed easily.  Psychiatric/Behavioral:  Negative for suicidal ideas. The patient is not nervous/anxious.    Patient Active Problem List   Diagnosis Date Noted   Routine general medical examination at a health care facility 05/02/2015   Dermatitis, eczematoid 05/02/2015   Blood pressure elevated 05/02/2015   Adiposity 05/02/2015    No Known Allergies  Past Surgical History:  Procedure Laterality Date   LIPOMA EXCISION     scapula   RECONSTRUCTION OF EYELID      Social History   Tobacco Use   Smoking status: Every Day    Types: Cigars   Smokeless tobacco: Never  Substance Use Topics   Alcohol use: Yes    Alcohol/week: 0.0 standard drinks   Drug use: No     Medication list has been reviewed and updated.  Current Meds  Medication Sig   atorvastatin (LIPITOR) 10 MG tablet Take 1 tablet (10 mg total) by mouth daily.   clotrimazole-betamethasone (LOTRISONE) cream APPLY TOPICALLY TO AFFECTED AREA EVERY DAY   hydrochlorothiazide (MICROZIDE) 12.5 MG capsule TAKE 1 CAPSULE BY MOUTH EVERY DAY   lisinopril-hydrochlorothiazide (ZESTORETIC) 10-12.5 MG tablet Take 1 tablet by mouth daily.   tadalafil (CIALIS) 5 MG tablet Take 1 tablet (5 mg total) by mouth daily as needed for erectile dysfunction.    PHQ 2/9 Scores 09/11/2021 03/29/2021 09/30/2020 01/06/2020  PHQ - 2 Score 0 0 0 0  PHQ- 9 Score 0 0 0 0    GAD 7 : Generalized Anxiety Score 09/11/2021 03/29/2021 09/30/2020  Nervous, Anxious, on Edge 0 0 0  Control/stop worrying 0 0 0  Worry too much - different things 0 0 0  Trouble relaxing 0 0 0  Restless 0 0 0  Easily annoyed or irritable 0 0 0  Afraid - awful might happen 0 0 0  Total GAD 7 Score 0 0 0    BP Readings from Last 3 Encounters:  09/11/21 138/80  03/29/21 120/84  09/30/20  130/80    Physical Exam Vitals and nursing note reviewed.  Constitutional:      Appearance: He is obese.  HENT:     Head: Normocephalic.     Right Ear: External ear normal.     Left Ear: External ear normal.     Nose: Nose normal.  Eyes:     General: No scleral icterus.       Right eye: No discharge.        Left eye: No discharge.     Conjunctiva/sclera: Conjunctivae normal.     Pupils: Pupils are equal, round, and reactive to light.  Neck:     Thyroid: No thyromegaly.     Vascular: No JVD.     Trachea: No tracheal deviation.  Cardiovascular:     Rate and Rhythm: Normal rate and regular rhythm.     Heart sounds: Normal heart sounds. No murmur heard.   No friction rub. No gallop.  Pulmonary:     Effort: No respiratory distress.     Breath sounds: Normal breath sounds. No wheezing or rales.  Abdominal:     General: Bowel sounds are normal.     Palpations: Abdomen is soft. There is no  mass.     Tenderness: There is no abdominal tenderness. There is no guarding or rebound.  Musculoskeletal:        General: No tenderness. Normal range of motion.     Cervical back: Normal range of motion and neck supple.  Lymphadenopathy:     Cervical: No cervical adenopathy.  Skin:    General: Skin is warm.     Findings: No rash.  Neurological:     Mental Status: He is alert and oriented to person, place, and time.     Cranial Nerves: No cranial nerve deficit.     Deep Tendon Reflexes: Reflexes are normal and symmetric.    Wt Readings from Last 3 Encounters:  09/11/21 271 lb (122.9 kg)  03/29/21 284 lb (128.8 kg)  09/30/20 281 lb (127.5 kg)    BP 138/80   Pulse 80   Ht 6\' 2"  (1.88 m)   Wt 271 lb (122.9 kg)   BMI 34.79 kg/m   Assessment and Plan:  1. Essential hypertension Chronic.  Controlled.  Stable.  Continue hydrochlorothiazide 12.5 mg once a day and lisinopril hydrochlorothiazide 12.5 once a day.  Will check comprehensive metabolic panel for electrolytes and GFR. -  hydrochlorothiazide (MICROZIDE) 12.5 MG capsule; TAKE 1 CAPSULE BY MOUTH EVERY DAY  Dispense: 90 capsule; Refill: 1 - lisinopril-hydrochlorothiazide (ZESTORETIC) 10-12.5 MG tablet; Take 1 tablet by mouth daily.  Dispense: 90 tablet; Refill: 1 - Comprehensive Metabolic Panel (CMET)  2. Mixed hyperlipidemia Chronic.  Recently started on atorvastatin but will be getting the first lipid panel on statin agent today.  Will check lipid panel. - atorvastatin (LIPITOR) 10 MG tablet; Take 1 tablet (10 mg total) by mouth daily.  Dispense: 90 tablet; Refill: 1 - Lipid Panel With LDL/HDL Ratio  3. Eczema, unspecified type Chronic.  Episodic.  Stable.  Continue Lotrisone on as-needed basis. - clotrimazole-betamethasone (LOTRISONE) cream; APPLY TOPICALLY TO AFFECTED AREA EVERY DAY  Dispense: 30 g; Refill: 0  4. Hyperglycemia New onset.  Stable.  Patient has had 2 consecutive elevations of glucose in the 1 35-1 45 range the last 2 fasting episodes.  Patient was to have gotten an A1c 6 months ago but we were not able to obtain that said therefore we will get an A1c since there is been elevations in the past in the next concern of a 12 pound weight loss somewhat unintentional over the last 6 months. - Comprehensive Metabolic Panel (CMET) - Hemoglobin A1c  5. Need for immunization against influenza Discussed and administered. - Flu Vaccine QUAD 20mo+IM (Fluarix, Fluzone & Alfiuria Quad PF)

## 2021-09-11 NOTE — Patient Instructions (Signed)
Diabetes Mellitus and Nutrition, Adult When you have diabetes, or diabetes mellitus, it is very important to have healthy eating habits because your blood sugar (glucose) levels are greatly affected by what you eat and drink. Eating healthy foods in the right amounts, at about the same times every day, can help you:  Control your blood glucose.  Lower your risk of heart disease.  Improve your blood pressure.  Reach or maintain a healthy weight. What can affect my meal plan? Every person with diabetes is different, and each person has different needs for a meal plan. Your health care provider may recommend that you work with a dietitian to make a meal plan that is best for you. Your meal plan may vary depending on factors such as:  The calories you need.  The medicines you take.  Your weight.  Your blood glucose, blood pressure, and cholesterol levels.  Your activity level.  Other health conditions you have, such as heart or kidney disease. How do carbohydrates affect me? Carbohydrates, also called carbs, affect your blood glucose level more than any other type of food. Eating carbs naturally raises the amount of glucose in your blood. Carb counting is a method for keeping track of how many carbs you eat. Counting carbs is important to keep your blood glucose at a healthy level, especially if you use insulin or take certain oral diabetes medicines. It is important to know how many carbs you can safely have in each meal. This is different for every person. Your dietitian can help you calculate how many carbs you should have at each meal and for each snack. How does alcohol affect me? Alcohol can cause a sudden decrease in blood glucose (hypoglycemia), especially if you use insulin or take certain oral diabetes medicines. Hypoglycemia can be a life-threatening condition. Symptoms of hypoglycemia, such as sleepiness, dizziness, and confusion, are similar to symptoms of having too much  alcohol.  Do not drink alcohol if: ? Your health care provider tells you not to drink. ? You are pregnant, may be pregnant, or are planning to become pregnant.  If you drink alcohol: ? Do not drink on an empty stomach. ? Limit how much you use to:  0-1 drink a day for women.  0-2 drinks a day for men. ? Be aware of how much alcohol is in your drink. In the [place] one drink equals one 12 oz bottle of beer (355 mL), one 5 oz glass of wine (148 mL), or one 1 oz glass of hard liquor (44 mL). ? Keep yourself hydrated with water, diet soda, or unsweetened iced tea.  Keep in mind that regular soda, juice, and other mixers may contain a lot of sugar and must be counted as carbs. What are tips for following this plan? Reading food labels  Start by checking the serving size on the "Nutrition Facts" label of packaged foods and drinks. The amount of calories, carbs, fats, and other nutrients listed on the label is based on one serving of the item. Many items contain more than one serving per package.  Check the total grams (g) of carbs in one serving. You can calculate the number of servings of carbs in one serving by dividing the total carbs by 15. For example, if a food has 30 g of total carbs per serving, it would be equal to 2 servings of carbs.  Check the number of grams (g) of saturated fats and trans fats in one serving. Choose foods that have   a low amount or none of these fats.  Check the number of milligrams (mg) of salt (sodium) in one serving. Most people should limit total sodium intake to less than 2,300 mg per day.  Always check the nutrition information of foods labeled as "low-fat" or "nonfat." These foods may be higher in added sugar or refined carbs and should be avoided.  Talk to your dietitian to identify your daily goals for nutrients listed on the label. Shopping  Avoid buying canned, pre-made, or processed foods. These foods tend to be high in fat, sodium, and added  sugar.  Shop around the outside edge of the grocery store. This is where you will most often find fresh fruits and vegetables, bulk grains, fresh meats, and fresh dairy. Cooking  Use low-heat cooking methods, such as baking, instead of high-heat cooking methods like deep frying.  Cook using healthy oils, such as olive, canola, or sunflower oil.  Avoid cooking with butter, cream, or high-fat meats. Meal planning  Eat meals and snacks regularly, preferably at the same times every day. Avoid going long periods of time without eating.  Eat foods that are high in fiber, such as fresh fruits, vegetables, beans, and whole grains. Talk with your dietitian about how many servings of carbs you can eat at each meal.  Eat 4-6 oz (112-168 g) of lean protein each day, such as lean meat, chicken, fish, eggs, or tofu. One ounce (oz) of lean protein is equal to: ? 1 oz (28 g) of meat, chicken, or fish. ? 1 egg. ?  cup (62 g) of tofu.  Eat some foods each day that contain healthy fats, such as avocado, nuts, seeds, and fish.   What foods should I eat? Fruits Berries. Apples. Oranges. Peaches. Apricots. Plums. Grapes. Mango. Papaya. Pomegranate. Kiwi. Cherries. Vegetables Lettuce. Spinach. Leafy greens, including kale, chard, collard greens, and mustard greens. Beets. Cauliflower. Cabbage. Broccoli. Carrots. Green beans. Tomatoes. Peppers. Onions. Cucumbers. Brussels sprouts. Grains Whole grains, such as whole-wheat or whole-grain bread, crackers, tortillas, cereal, and pasta. Unsweetened oatmeal. Quinoa. Brown or wild rice. Meats and other proteins Seafood. Poultry without skin. Lean cuts of poultry and beef. Tofu. Nuts. Seeds. Dairy Low-fat or fat-free dairy products such as milk, yogurt, and cheese. The items listed above may not be a complete list of foods and beverages you can eat. Contact a dietitian for more information. What foods should I avoid? Fruits Fruits canned with  syrup. Vegetables Canned vegetables. Frozen vegetables with butter or cream sauce. Grains Refined white flour and flour products such as bread, pasta, snack foods, and cereals. Avoid all processed foods. Meats and other proteins Fatty cuts of meat. Poultry with skin. Breaded or fried meats. Processed meat. Avoid saturated fats. Dairy Full-fat yogurt, cheese, or milk. Beverages Sweetened drinks, such as soda or iced tea. The items listed above may not be a complete list of foods and beverages you should avoid. Contact a dietitian for more information. Questions to ask a health care provider  Do I need to meet with a diabetes educator?  Do I need to meet with a dietitian?  What number can I call if I have questions?  When are the best times to check my blood glucose? Where to find more information:  American Diabetes Association: diabetes.org  Academy of Nutrition and Dietetics: www.eatright.org  National Institute of Diabetes and Digestive and Kidney Diseases: www.niddk.nih.gov  Association of Diabetes Care and Education Specialists: www.diabeteseducator.org Summary  It is important to have healthy eating   habits because your blood sugar (glucose) levels are greatly affected by what you eat and drink.  A healthy meal plan will help you control your blood glucose and maintain a healthy lifestyle.  Your health care provider may recommend that you work with a dietitian to make a meal plan that is best for you.  Keep in mind that carbohydrates (carbs) and alcohol have immediate effects on your blood glucose levels. It is important to count carbs and to use alcohol carefully. This information is not intended to replace advice given to you by your health care provider. Make sure you discuss any questions you have with your health care provider. Document Revised: 11/10/2019 Document Reviewed: 11/10/2019 Elsevier Patient Education  2021 Elsevier Inc.  

## 2021-09-12 ENCOUNTER — Other Ambulatory Visit: Payer: Self-pay

## 2021-09-12 DIAGNOSIS — E119 Type 2 diabetes mellitus without complications: Secondary | ICD-10-CM

## 2021-09-12 LAB — LIPID PANEL WITH LDL/HDL RATIO
Cholesterol, Total: 176 mg/dL (ref 100–199)
HDL: 32 mg/dL — ABNORMAL LOW (ref >39)
LDL Chol Calc (NIH): 123 mg/dL — ABNORMAL HIGH (ref 0–99)
LDL/HDL Ratio: 3.8 ratio — ABNORMAL HIGH (ref 0.0–3.6)
Triglycerides: 115 mg/dL (ref 0–149)
VLDL Cholesterol Cal: 21 mg/dL (ref 5–40)

## 2021-09-12 LAB — COMPREHENSIVE METABOLIC PANEL
ALT: 33 IU/L (ref 0–44)
AST: 42 IU/L — ABNORMAL HIGH (ref 0–40)
Albumin/Globulin Ratio: 1.7 (ref 1.2–2.2)
Albumin: 4.9 g/dL (ref 3.8–4.9)
Alkaline Phosphatase: 138 IU/L — ABNORMAL HIGH (ref 44–121)
BUN/Creatinine Ratio: 14 (ref 9–20)
BUN: 16 mg/dL (ref 6–24)
Bilirubin Total: 0.9 mg/dL (ref 0.0–1.2)
CO2: 24 mmol/L (ref 20–29)
Calcium: 9.8 mg/dL (ref 8.7–10.2)
Chloride: 96 mmol/L (ref 96–106)
Creatinine, Ser: 1.11 mg/dL (ref 0.76–1.27)
Globulin, Total: 2.9 g/dL (ref 1.5–4.5)
Glucose: 156 mg/dL — ABNORMAL HIGH (ref 70–99)
Potassium: 4.6 mmol/L (ref 3.5–5.2)
Sodium: 138 mmol/L (ref 134–144)
Total Protein: 7.8 g/dL (ref 6.0–8.5)
eGFR: 79 mL/min/{1.73_m2} (ref >59)

## 2021-09-12 LAB — HEMOGLOBIN A1C
Est. average glucose Bld gHb Est-mCnc: 148 mg/dL
Hgb A1c MFr Bld: 6.8 % — ABNORMAL HIGH (ref 4.8–5.6)

## 2021-09-12 MED ORDER — METFORMIN HCL 500 MG PO TABS: 500.0000 mg | ORAL_TABLET | Freq: Every day | ORAL | 1 refills | Status: DC

## 2021-10-17 ENCOUNTER — Other Ambulatory Visit: Payer: Self-pay | Admitting: Family Medicine

## 2021-10-17 DIAGNOSIS — E119 Type 2 diabetes mellitus without complications: Secondary | ICD-10-CM

## 2021-10-17 NOTE — Telephone Encounter (Signed)
Requested Prescriptions  Pending Prescriptions Disp Refills  . metFORMIN (GLUCOPHAGE) 500 MG tablet [Pharmacy Med Name: METFORMIN HCL 500 MG TABLET] 30 tablet 1    Sig: TAKE 1 TABLET BY MOUTH [place]     Endocrinology:  Diabetes - Biguanides Passed - 10/17/2021  9:31 AM      Passed - Cr in normal range and within 360 days    Creatinine, Ser  Date Value Ref Range Status  09/11/2021 1.11 0.76 - 1.27 mg/dL Final         Passed - HBA1C is between 0 and 7.9 and within 180 days    Hgb A1c MFr Bld  Date Value Ref Range Status  09/11/2021 6.8 (H) 4.8 - 5.6 % Final    Comment:             Prediabetes: 5.7 - 6.4          Diabetes: >6.4          Glycemic control for adults with diabetes: <7.0          Passed - eGFR in normal range and within 360 days    GFR calc Af Amer  Date Value Ref Range Status  09/30/2020 94 >59 mL/min/1.73 Final    Comment:    **In accordance with recommendations from the NKF-ASN Task force,**   [facility] is in the process of updating its eGFR calculation to the   2021 CKD-EPI creatinine equation that estimates kidney function   without a race variable.    GFR calc non Af Amer  Date Value Ref Range Status  09/30/2020 82 >59 mL/min/1.73 Final   eGFR  Date Value Ref Range Status  09/11/2021 79 >59 mL/min/1.73 Final         Passed - Valid encounter within last 6 months    Recent Outpatient Visits          1 month ago Essential hypertension   [place], [place]   6 months ago Essential hypertension   [facility] Juline Patch, MD   1 year ago Annual physical exam   [facility] Juline Patch, MD   1 year ago Essential hypertension   [facility] Juline Patch, MD   2 years ago Annual physical exam   [facility]      Future Appointments            In 2 months Juline Patch, MD [facility], [facility]

## 2021-11-14 DIAGNOSIS — D124 Benign neoplasm of descending colon: Secondary | ICD-10-CM | POA: Diagnosis not present

## 2021-11-14 DIAGNOSIS — D122 Benign neoplasm of ascending colon: Secondary | ICD-10-CM | POA: Diagnosis not present

## 2021-11-14 DIAGNOSIS — D127 Benign neoplasm of rectosigmoid junction: Secondary | ICD-10-CM | POA: Diagnosis not present

## 2021-11-14 DIAGNOSIS — K648 Other hemorrhoids: Secondary | ICD-10-CM | POA: Diagnosis not present

## 2021-11-14 DIAGNOSIS — Z1211 Encounter for screening for malignant neoplasm of colon: Secondary | ICD-10-CM | POA: Diagnosis not present

## 2021-11-14 DIAGNOSIS — Z86010 Personal history of colonic polyps: Secondary | ICD-10-CM | POA: Diagnosis not present

## 2021-11-14 DIAGNOSIS — K635 Polyp of colon: Secondary | ICD-10-CM | POA: Diagnosis not present

## 2021-11-16 ENCOUNTER — Other Ambulatory Visit: Payer: Self-pay | Admitting: Family Medicine

## 2021-11-16 DIAGNOSIS — E119 Type 2 diabetes mellitus without complications: Secondary | ICD-10-CM

## 2021-11-17 NOTE — Telephone Encounter (Signed)
Requested Prescriptions  Pending Prescriptions Disp Refills  . metFORMIN (GLUCOPHAGE) 500 MG tablet [Pharmacy Med Name: METFORMIN HCL 500 MG TABLET] 30 tablet 1    Sig: TAKE 1 TABLET BY MOUTH [place]     Endocrinology:  Diabetes - Biguanides Passed - 11/16/2021  9:31 AM      Passed - Cr in normal range and within 360 days    Creatinine, Ser  Date Value Ref Range Status  09/11/2021 1.11 0.76 - 1.27 mg/dL Final         Passed - HBA1C is between 0 and 7.9 and within 180 days    Hgb A1c MFr Bld  Date Value Ref Range Status  09/11/2021 6.8 (H) 4.8 - 5.6 % Final    Comment:             Prediabetes: 5.7 - 6.4          Diabetes: >6.4          Glycemic control for adults with diabetes: <7.0          Passed - eGFR in normal range and within 360 days    GFR calc Af Amer  Date Value Ref Range Status  09/30/2020 94 >59 mL/min/1.73 Final    Comment:    **In accordance with recommendations from the NKF-ASN Task force,**   [facility] is in the process of updating its eGFR calculation to the   2021 CKD-EPI creatinine equation that estimates kidney function   without a race variable.    GFR calc non Af Amer  Date Value Ref Range Status  09/30/2020 82 >59 mL/min/1.73 Final   eGFR  Date Value Ref Range Status  09/11/2021 79 >59 mL/min/1.73 Final         Passed - Valid encounter within last 6 months    Recent Outpatient Visits          2 months ago Essential hypertension   [place], [place]   7 months ago Essential hypertension   [facility] Juline Patch, MD   1 year ago Annual physical exam   [facility] Juline Patch, MD   1 year ago Essential hypertension   [facility] Juline Patch, MD   2 years ago Annual physical exam   [facility] Juline Patch, MD      Future Appointments            In 1 month Juline Patch, MD [facility], [facility]

## 2021-12-19 ENCOUNTER — Other Ambulatory Visit: Payer: Self-pay | Admitting: Family Medicine

## 2021-12-19 DIAGNOSIS — G4733 Obstructive sleep apnea (adult) (pediatric): Secondary | ICD-10-CM | POA: Diagnosis not present

## 2021-12-19 DIAGNOSIS — E119 Type 2 diabetes mellitus without complications: Secondary | ICD-10-CM

## 2021-12-20 NOTE — Telephone Encounter (Signed)
Requested medications are due for refill today.  Too soon  Requested medications are on the active medications list.  yes  Last refill. 11/17/2021  Future visit scheduled.   yes  Notes to clinic.  Pt was given 30/1 on 9/27. 30/1 on 11/1, and 30/1 on 12/2 . Pt has gotten a 2 months supply each month for the past  months. Pt should have enough medication to get to next appointment with Dr. Ronnald Ramp. Unsure if Dr. Ronnald Ramp will keep pt on this medication as this was a trial. Please advise.    Requested Prescriptions  Pending Prescriptions Disp Refills   metFORMIN (GLUCOPHAGE) 500 MG tablet [Pharmacy Med Name: METFORMIN HCL 500 MG TABLET] 30 tablet 0    Sig: TAKE 1 TABLET BY MOUTH EVERY DAY WITH BREAKFAST     Endocrinology:  Diabetes - Biguanides Passed - 12/19/2021  8:31 AM      Passed - Cr in normal range and within 360 days    Creatinine, Ser  Date Value Ref Range Status  09/11/2021 1.11 0.76 - 1.27 mg/dL Final          Passed - HBA1C is between 0 and 7.9 and within 180 days    Hgb A1c MFr Bld  Date Value Ref Range Status  09/11/2021 6.8 (H) 4.8 - 5.6 % Final    Comment:             Prediabetes: 5.7 - 6.4          Diabetes: >6.4          Glycemic control for adults with diabetes: <7.0           Passed - eGFR in normal range and within 360 days    GFR calc Af Amer  Date Value Ref Range Status  09/30/2020 94 >59 mL/min/1.73 Final    Comment:    **In accordance with recommendations from the NKF-ASN Task force,**   [facility] is in the process of updating its eGFR calculation to the   2021 CKD-EPI creatinine equation that estimates kidney function   without a race variable.    GFR calc non Af Amer  Date Value Ref Range Status  09/30/2020 82 >59 mL/min/1.73 Final   eGFR  Date Value Ref Range Status  09/11/2021 79 >59 mL/min/1.73 Final          Passed - Valid encounter within last 6 months    Recent Outpatient Visits           3 months ago Essential hypertension   [place], [place]   8 months ago Essential hypertension   [facility] Juline Patch, MD   1 year ago Annual physical exam   [facility] Juline Patch, MD   1 year ago Essential hypertension   [facility] Juline Patch, MD   2 years ago Annual physical exam   [facility] Juline Patch, MD       Future Appointments             In 3 weeks Juline Patch, MD [facility], [facility]

## 2021-12-29 ENCOUNTER — Ambulatory Visit: Payer: BC Managed Care – PPO | Admitting: Family Medicine

## 2022-01-10 ENCOUNTER — Encounter: Payer: Self-pay | Admitting: Family Medicine

## 2022-01-10 ENCOUNTER — Ambulatory Visit (INDEPENDENT_AMBULATORY_CARE_PROVIDER_SITE_OTHER): Payer: BC Managed Care – PPO | Admitting: Family Medicine

## 2022-01-10 ENCOUNTER — Other Ambulatory Visit: Payer: Self-pay

## 2022-01-10 VITALS — BP 136/86 | HR 68 | Ht 74.0 in | Wt 278.0 lb

## 2022-01-10 DIAGNOSIS — L739 Follicular disorder, unspecified: Secondary | ICD-10-CM

## 2022-01-10 DIAGNOSIS — E119 Type 2 diabetes mellitus without complications: Secondary | ICD-10-CM

## 2022-01-10 MED ORDER — MUPIROCIN CALCIUM 2 % EX CREA: 1.0000 "application " | TOPICAL_CREAM | Freq: Two times a day (BID) | CUTANEOUS | 0 refills | Status: DC

## 2022-01-10 MED ORDER — MUPIROCIN 2 % EX OINT: 1.0000 "application " | TOPICAL_OINTMENT | Freq: Two times a day (BID) | CUTANEOUS | 2 refills | Status: DC

## 2022-01-10 MED ORDER — METFORMIN HCL 500 MG PO TABS: ORAL_TABLET | ORAL | 1 refills | Status: DC

## 2022-01-10 MED ORDER — DOXYCYCLINE HYCLATE 100 MG PO TABS: 100.0000 mg | ORAL_TABLET | Freq: Two times a day (BID) | ORAL | 1 refills | Status: DC

## 2022-01-10 NOTE — Progress Notes (Signed)
Date:  01/10/2022   Name:  Adam Olson   DOB:  July 04, 1966   MRN:  [id number]   Chief Complaint: Diabetes (Not checking BS at home)  Diabetes He presents for his follow-up diabetic visit. He has type 2 diabetes mellitus. His disease course has been stable. There are no hypoglycemic associated symptoms. Pertinent negatives for hypoglycemia include no dizziness, headaches or nervousness/anxiousness. There are no diabetic associated symptoms. Pertinent negatives for diabetes include no blurred vision, no chest pain, no fatigue, no foot paresthesias, no foot ulcerations, no polydipsia, no polyphagia, no polyuria, no visual change, no weakness and no weight loss. There are no hypoglycemic complications. Symptoms are stable. There are no diabetic complications. Risk factors for coronary artery disease include obesity and tobacco exposure. Current diabetic treatment includes oral agent (monotherapy). He is following a generally healthy diet. He participates in exercise intermittently. His home blood glucose trend is fluctuating minimally. He does not see a podiatrist.Eye exam is not current.   Lab Results  Component Value Date   NA 138 09/11/2021   K 4.6 09/11/2021   CO2 24 09/11/2021   GLUCOSE 156 (H) 09/11/2021   BUN 16 09/11/2021   CREATININE 1.11 09/11/2021   CALCIUM 9.8 09/11/2021   EGFR 79 09/11/2021   GFRNONAA 82 09/30/2020   Lab Results  Component Value Date   CHOL 176 09/11/2021   HDL 32 (L) 09/11/2021   LDLCALC 123 (H) 09/11/2021   TRIG 115 09/11/2021   CHOLHDL 6.3 (H) 05/22/2016   No results found for: TSH Lab Results  Component Value Date   HGBA1C 6.8 (H) 09/11/2021   Lab Results  Component Value Date   WBC 7.8 09/30/2020   HGB 13.6 09/30/2020   HCT 41.1 09/30/2020   MCV 82 09/30/2020   PLT 180 09/30/2020   Lab Results  Component Value Date   ALT 33 09/11/2021   AST 42 (H) 09/11/2021   ALKPHOS 138 (H) 09/11/2021   BILITOT 0.9 09/11/2021   No results found  for: 25OHVITD2, 25OHVITD3, VD25OH   Review of Systems  Constitutional:  Negative for chills, fatigue, fever and weight loss.  HENT:  Negative for drooling, ear discharge, ear pain and sore throat.   Eyes:  Negative for blurred vision.  Respiratory:  Negative for cough, shortness of breath and wheezing.   Cardiovascular:  Negative for chest pain, palpitations and leg swelling.  Gastrointestinal:  Negative for abdominal pain, blood in stool, constipation, diarrhea and nausea.  Endocrine: Negative for polydipsia, polyphagia and polyuria.  Genitourinary:  Negative for dysuria, frequency, hematuria and urgency.  Musculoskeletal:  Negative for back pain, myalgias and neck pain.  Skin:  Negative for rash.  Allergic/Immunologic: Negative for environmental allergies.  Neurological:  Negative for dizziness, weakness and headaches.  Hematological:  Does not bruise/bleed easily.  Psychiatric/Behavioral:  Negative for suicidal ideas. The patient is not nervous/anxious.    Patient Active Problem List   Diagnosis Date Noted   Mixed hyperlipidemia 09/11/2021   Essential hypertension 09/11/2021   Routine general medical examination at a health care facility 05/02/2015   Dermatitis, eczematoid 05/02/2015   Blood pressure elevated 05/02/2015   Adiposity 05/02/2015    No Known Allergies  Past Surgical History:  Procedure Laterality Date   LIPOMA EXCISION     scapula   RECONSTRUCTION OF EYELID      Social History   Tobacco Use   Smoking status: Every Day    Types: Cigars   Smokeless tobacco: Never  Substance Use Topics   Alcohol use: Yes    Alcohol/week: 0.0 standard drinks   Drug use: No     Medication list has been reviewed and updated.  Current Meds  Medication Sig   atorvastatin (LIPITOR) 10 MG tablet Take 1 tablet (10 mg total) by mouth daily.   clotrimazole-betamethasone (LOTRISONE) cream APPLY TOPICALLY TO AFFECTED AREA EVERY DAY   hydrochlorothiazide (MICROZIDE) 12.5 MG  capsule TAKE 1 CAPSULE BY MOUTH EVERY DAY   lisinopril-hydrochlorothiazide (ZESTORETIC) 10-12.5 MG tablet Take 1 tablet by mouth daily.   metFORMIN (GLUCOPHAGE) 500 MG tablet TAKE 1 TABLET BY MOUTH EVERY DAY WITH BREAKFAST   tadalafil (CIALIS) 5 MG tablet Take 1 tablet (5 mg total) by mouth daily as needed for erectile dysfunction.    PHQ 2/9 Scores 09/11/2021 03/29/2021 09/30/2020 01/06/2020  PHQ - 2 Score 0 0 0 0  PHQ- 9 Score 0 0 0 0    GAD 7 : Generalized Anxiety Score 09/11/2021 03/29/2021 09/30/2020  Nervous, Anxious, on Edge 0 0 0  Control/stop worrying 0 0 0  Worry too much - different things 0 0 0  Trouble relaxing 0 0 0  Restless 0 0 0  Easily annoyed or irritable 0 0 0  Afraid - awful might happen 0 0 0  Total GAD 7 Score 0 0 0    BP Readings from Last 3 Encounters:  01/10/22 136/86  09/11/21 138/80  03/29/21 120/84    Physical Exam Skin:    Findings: Acne present. No erythema or rash.     Comments: Localized induration    Wt Readings from Last 3 Encounters:  01/10/22 278 lb (126.1 kg)  09/11/21 271 lb (122.9 kg)  03/29/21 284 lb (128.8 kg)    BP 136/86    Pulse 68    Ht $R'6\' 2"'zv$  (1.88 m)    Wt 278 lb (126.1 kg)    BMI 35.69 kg/m   Assessment and Plan:  1. Controlled type 2 diabetes mellitus without complication, without long-term current use of insulin (HCC) Chronic.  Controlled.  Stable.  Continue metformin 500 mg once a day.  Recheck A1c and microalbuminuria. - metFORMIN (GLUCOPHAGE) 500 MG tablet; TAKE 1 TABLET BY MOUTH EVERY DAY WITH BREAKFAST  Dispense: 90 tablet; Refill: 1 - HgB A1c - Microalbumin, urine  2. Folliculitis New onset.  Persistent.  Area of induration in the perineum consistent with folliculitis.  We will treat with doxycycline 100 mg twice a day and then after 10 days go to once a day.  We will treat with Bactroban ointment twice a day. - mupirocin cream (BACTROBAN) 2 %; Apply 1 application topically 2 (two) times daily.  Dispense: 15 g;  Refill: 0 - doxycycline (VIBRA-TABS) 100 MG tablet; Take 1 tablet (100 mg total) by mouth 2 (two) times daily.  Dispense: 60 tablet; Refill: 1 - mupirocin ointment (BACTROBAN) 2 %; Apply 1 application topically 2 (two) times daily.  Dispense: 22 g; Refill: 2

## 2022-01-11 LAB — HEMOGLOBIN A1C
Est. average glucose Bld gHb Est-mCnc: 137 mg/dL
Hgb A1c MFr Bld: 6.4 % — ABNORMAL HIGH (ref 4.8–5.6)

## 2022-01-11 LAB — MICROALBUMIN, URINE: Microalbumin, Urine: 201 ug/mL

## 2022-01-19 DIAGNOSIS — G4733 Obstructive sleep apnea (adult) (pediatric): Secondary | ICD-10-CM | POA: Diagnosis not present

## 2022-02-16 DIAGNOSIS — G4733 Obstructive sleep apnea (adult) (pediatric): Secondary | ICD-10-CM | POA: Diagnosis not present

## 2022-02-28 ENCOUNTER — Other Ambulatory Visit: Payer: Self-pay | Admitting: Family Medicine

## 2022-02-28 DIAGNOSIS — I10 Essential (primary) hypertension: Secondary | ICD-10-CM

## 2022-03-10 ENCOUNTER — Other Ambulatory Visit: Payer: Self-pay | Admitting: Family Medicine

## 2022-03-10 DIAGNOSIS — L739 Follicular disorder, unspecified: Secondary | ICD-10-CM

## 2022-03-12 ENCOUNTER — Other Ambulatory Visit: Payer: Self-pay | Admitting: Family Medicine

## 2022-03-12 DIAGNOSIS — E782 Mixed hyperlipidemia: Secondary | ICD-10-CM

## 2022-03-12 NOTE — Telephone Encounter (Signed)
Requested medications are due for refill today.  yes ? ?Requested medications are on the active medications list.  yes ? ?Last refill. 01/10/2022 ? ?Future visit scheduled.   yes ? ?Notes to clinic.  Medication is not assigned to a protocol - please review for refill. ? ? ? ?Requested Prescriptions  ?Pending Prescriptions Disp Refills  ? doxycycline (VIBRA-TABS) 100 MG tablet [Pharmacy Med Name: DOXYCYCLINE HYCLATE 100 MG TAB] 60 tablet 1  ?  Sig: TAKE 1 TABLET BY MOUTH TWICE A DAY  ?  ? Off-Protocol Failed - 03/10/2022  1:39 AM  ?  ?  Failed - Medication not assigned to a protocol, review manually.  ?  ?  Passed - Valid encounter within last 12 months  ?  Recent Outpatient Visits   ? ?      ? 2 months ago Controlled type 2 diabetes mellitus without complication, without long-term current use of insulin (HCC)  ? [facility] Duanne Limerick, MD  ? 6 months ago Essential hypertension  ? [facility] Duanne Limerick, MD  ? 11 months ago Essential hypertension  ? [facility] Duanne Limerick, MD  ? 1 year ago Annual physical exam  ? [facility] Duanne Limerick, MD  ? 2 years ago Essential hypertension  ? [facility] Duanne Limerick, MD  ? ?  ?  ?Future Appointments   ? ?        ? In 1 month Duanne Limerick, MD [facility]  ? ?  ? ?  ?  ?  ?  ?

## 2022-03-19 DIAGNOSIS — G4733 Obstructive sleep apnea (adult) (pediatric): Secondary | ICD-10-CM | POA: Diagnosis not present

## 2022-04-18 DIAGNOSIS — G4733 Obstructive sleep apnea (adult) (pediatric): Secondary | ICD-10-CM | POA: Diagnosis not present

## 2022-05-10 ENCOUNTER — Encounter: Payer: Self-pay | Admitting: Family Medicine

## 2022-05-10 ENCOUNTER — Ambulatory Visit (INDEPENDENT_AMBULATORY_CARE_PROVIDER_SITE_OTHER): Payer: BC Managed Care – PPO | Admitting: Family Medicine

## 2022-05-10 VITALS — BP 138/88 | HR 80 | Ht 73.0 in | Wt 272.0 lb

## 2022-05-10 DIAGNOSIS — E782 Mixed hyperlipidemia: Secondary | ICD-10-CM

## 2022-05-10 DIAGNOSIS — Z23 Encounter for immunization: Secondary | ICD-10-CM

## 2022-05-10 DIAGNOSIS — E119 Type 2 diabetes mellitus without complications: Secondary | ICD-10-CM

## 2022-05-10 DIAGNOSIS — I10 Essential (primary) hypertension: Secondary | ICD-10-CM | POA: Diagnosis not present

## 2022-05-10 MED ORDER — ATORVASTATIN CALCIUM 10 MG PO TABS: 10.0000 mg | ORAL_TABLET | Freq: Every day | ORAL | 1 refills | Status: DC

## 2022-05-10 MED ORDER — METFORMIN HCL 500 MG PO TABS: ORAL_TABLET | ORAL | 1 refills | Status: DC

## 2022-05-10 MED ORDER — LISINOPRIL-HYDROCHLOROTHIAZIDE 10-12.5 MG PO TABS: 1.0000 | ORAL_TABLET | Freq: Every day | ORAL | 1 refills | Status: DC

## 2022-05-10 MED ORDER — HYDROCHLOROTHIAZIDE 12.5 MG PO CAPS: ORAL_CAPSULE | ORAL | 1 refills | Status: DC

## 2022-05-10 NOTE — Progress Notes (Signed)
Date:  05/10/2022   Name:  Adam Olson   DOB:  [birth year]   MRN:  [id number]   Chief Complaint: Hypertension, Diabetes, and Hyperlipidemia  Hypertension This is a chronic problem. The current episode started more than 1 year ago. The problem has been gradually worsening since onset. The problem is controlled. Pertinent negatives include no anxiety, blurred vision, chest pain, headaches, malaise/fatigue, neck pain, orthopnea, palpitations, peripheral edema, PND, shortness of breath or sweats. There are no associated agents to hypertension. Risk factors for coronary artery disease include dyslipidemia and obesity. Past treatments include diuretics and ACE inhibitors. The current treatment provides moderate improvement. There are no compliance problems.  There is no history of angina, kidney disease, CAD/MI, CVA, heart failure, left ventricular hypertrophy, PVD or retinopathy. There is no history of chronic renal disease, a hypertension causing med or renovascular disease.  Diabetes He presents for his follow-up diabetic visit. He has type 2 diabetes mellitus. His disease course has been stable. There are no hypoglycemic associated symptoms. Pertinent negatives for hypoglycemia include no dizziness, headaches, nervousness/anxiousness or sweats. Pertinent negatives for diabetes include no blurred vision, no chest pain, no fatigue, no foot paresthesias, no foot ulcerations, no polydipsia, no polyphagia, no polyuria, no visual change, no weakness and no weight loss. There are no hypoglycemic complications. Symptoms are stable. There are no diabetic complications. Pertinent negatives for diabetic complications include no CVA, PVD or retinopathy. Risk factors for coronary artery disease include dyslipidemia and hypertension. Current diabetic treatment includes oral agent (monotherapy). He is following a generally healthy diet. Meal planning includes avoidance of concentrated sweets and carbohydrate  counting. An ACE inhibitor/angiotensin II receptor blocker is being taken.  Hyperlipidemia This is a chronic problem. The current episode started more than 1 year ago. The problem is controlled. Recent lipid tests were reviewed and are normal. Exacerbating diseases include obesity. He has no history of chronic renal disease or diabetes. Factors aggravating his hyperlipidemia include thiazides. Pertinent negatives include no chest pain, myalgias or shortness of breath. Current antihyperlipidemic treatment includes statins. The current treatment provides moderate improvement of lipids.   Lab Results  Component Value Date   NA 138 09/11/2021   K 4.6 09/11/2021   CO2 24 09/11/2021   GLUCOSE 156 (H) 09/11/2021   BUN 16 09/11/2021   CREATININE 1.11 09/11/2021   CALCIUM 9.8 09/11/2021   EGFR 79 09/11/2021   GFRNONAA 82 09/30/2020   Lab Results  Component Value Date   CHOL 176 09/11/2021   HDL 32 (L) 09/11/2021   LDLCALC 123 (H) 09/11/2021   TRIG 115 09/11/2021   CHOLHDL 6.3 (H) 05/22/2016   No results found for: TSH Lab Results  Component Value Date   HGBA1C 6.4 (H) 01/10/2022   Lab Results  Component Value Date   WBC 7.8 09/30/2020   HGB 13.6 09/30/2020   HCT 41.1 09/30/2020   MCV 82 09/30/2020   PLT 180 09/30/2020   Lab Results  Component Value Date   ALT 33 09/11/2021   AST 42 (H) 09/11/2021   ALKPHOS 138 (H) 09/11/2021   BILITOT 0.9 09/11/2021   No results found for: 25OHVITD2, 25OHVITD3, VD25OH   Review of Systems  Constitutional:  Negative for chills, fatigue, fever, malaise/fatigue and weight loss.  HENT:  Negative for drooling, ear discharge, ear pain and sore throat.   Eyes:  Negative for blurred vision.  Respiratory:  Negative for cough, shortness of breath and wheezing.   Cardiovascular:  Negative for chest pain,  palpitations, orthopnea, leg swelling and PND.  Gastrointestinal:  Negative for abdominal pain, blood in stool, constipation, diarrhea and nausea.   Endocrine: Negative for polydipsia, polyphagia and polyuria.  Genitourinary:  Negative for dysuria, frequency, hematuria and urgency.  Musculoskeletal:  Negative for back pain, myalgias and neck pain.  Skin:  Negative for rash.  Allergic/Immunologic: Negative for environmental allergies.  Neurological:  Negative for dizziness, weakness and headaches.  Hematological:  Does not bruise/bleed easily.  Psychiatric/Behavioral:  Negative for suicidal ideas. The patient is not nervous/anxious.    Patient Active Problem List   Diagnosis Date Noted   Mixed hyperlipidemia 09/11/2021   Essential hypertension 09/11/2021   Routine general medical examination at a health care facility 05/02/2015   Dermatitis, eczematoid 05/02/2015   Blood pressure elevated 05/02/2015   Adiposity 05/02/2015    No Known Allergies  Past Surgical History:  Procedure Laterality Date   LIPOMA EXCISION     scapula   RECONSTRUCTION OF EYELID      Social History   Tobacco Use   Smoking status: Every Day    Types: Cigars   Smokeless tobacco: Never  Substance Use Topics   Alcohol use: Yes    Alcohol/week: 0.0 standard drinks   Drug use: No     Medication list has been reviewed and updated.  Current Meds  Medication Sig   atorvastatin (LIPITOR) 10 MG tablet TAKE 1 TABLET BY MOUTH EVERY DAY   clotrimazole-betamethasone (LOTRISONE) cream APPLY TOPICALLY TO AFFECTED AREA EVERY DAY   hydrochlorothiazide (MICROZIDE) 12.5 MG capsule TAKE 1 CAPSULE BY MOUTH EVERY DAY   lisinopril-hydrochlorothiazide (ZESTORETIC) 10-12.5 MG tablet TAKE 1 TABLET BY MOUTH EVERY DAY   metFORMIN (GLUCOPHAGE) 500 MG tablet TAKE 1 TABLET BY MOUTH EVERY DAY WITH BREAKFAST   mupirocin ointment (BACTROBAN) 2 % Apply 1 application topically 2 (two) times daily.   tadalafil (CIALIS) 5 MG tablet Take 1 tablet (5 mg total) by mouth daily as needed for erectile dysfunction.       05/10/2022    8:17 AM 09/11/2021    9:55 AM 03/29/2021   10:28  AM 09/30/2020    9:48 AM  GAD 7 : Generalized Anxiety Score  Nervous, Anxious, on Edge 0 0 0 0  Control/stop worrying 0 0 0 0  Worry too much - different things 0 0 0 0  Trouble relaxing 0 0 0 0  Restless 0 0 0 0  Easily annoyed or irritable 0 0 0 0  Afraid - awful might happen 0 0 0 0  Total GAD 7 Score 0 0 0 0  Anxiety Difficulty Not difficult at all          05/10/2022    8:17 AM  Depression screen PHQ 2/9  Decreased Interest 0  Down, Depressed, Hopeless 0  PHQ - 2 Score 0  Altered sleeping 0  Tired, decreased energy 0  Change in appetite 0  Feeling bad or failure about yourself  0  Trouble concentrating 0  Moving slowly or fidgety/restless 0  Suicidal thoughts 0  PHQ-9 Score 0  Difficult doing work/chores Not difficult at all    BP Readings from Last 3 Encounters:  05/10/22 138/88  01/10/22 136/86  09/11/21 138/80    Physical Exam HENT:     Head: Normocephalic.     Right Ear: Tympanic membrane and external ear normal.     Left Ear: Tympanic membrane and external ear normal.  Eyes:     Pupils: Pupils are equal, round, and  reactive to light.  Neck:     Thyroid: No thyromegaly.     Vascular: No JVD.     Trachea: No tracheal deviation.  Cardiovascular:     Rate and Rhythm: Normal rate and regular rhythm.     Heart sounds: Normal heart sounds, S1 normal and S2 normal. No murmur heard. No systolic murmur is present.  No diastolic murmur is present.    No friction rub. No gallop. No S3 or S4 sounds.  Pulmonary:     Effort: No respiratory distress.     Breath sounds: Normal breath sounds. No stridor. No wheezing, rhonchi or rales.  Abdominal:     General: Bowel sounds are normal.     Palpations: Abdomen is soft.     Tenderness: There is no guarding.  Musculoskeletal:        General: No tenderness.     Cervical back: Normal range of motion and neck supple.  Lymphadenopathy:     Cervical: No cervical adenopathy.  Skin:    General: Skin is warm.      Findings: No rash.  Neurological:     Mental Status: He is alert.     Cranial Nerves: No cranial nerve deficit.     Motor: No weakness.     Coordination: Coordination normal.    Wt Readings from Last 3 Encounters:  05/10/22 272 lb (123.4 kg)  01/10/22 278 lb (126.1 kg)  09/11/21 271 lb (122.9 kg)    BP 138/88   Pulse 80   Ht $R'6\' 1"'fS$  (1.854 m)   Wt 272 lb (123.4 kg)   BMI 35.89 kg/m   Assessment and Plan:  1. Mixed hyperlipidemia Chronic.  Controlled.  Stable.  Continue atorvastatin 10 mg once a day.  Will check lipid panel. - atorvastatin (LIPITOR) 10 MG tablet; Take 1 tablet (10 mg total) by mouth daily.  Dispense: 90 tablet; Refill: 1 - Lipid Panel With LDL/HDL Ratio  2. Essential hypertension Chronic.  Controlled.  Stable.  Continue hydrochlorothiazide 12.5 mg supplementing the lisinopril hydrochlorothiazide 10-12 0.5 for current blood pressure reading of 138/88.  We will recheck in 4 months.  We will check CMP for electrolytes and GFR. - hydrochlorothiazide (MICROZIDE) 12.5 MG capsule; TAKE 1 CAPSULE BY MOUTH EVERY DAY  Dispense: 90 capsule; Refill: 1 - lisinopril-hydrochlorothiazide (ZESTORETIC) 10-12.5 MG tablet; Take 1 tablet by mouth daily.  Dispense: 90 tablet; Refill: 1 - Comprehensive Metabolic Panel (CMET)  3. Controlled type 2 diabetes mellitus without complication, without long-term current use of insulin (HCC) Chronic.  Controlled.  Stable.  Continue metformin 500 mg once a day.  Will check A1c for current status - metFORMIN (GLUCOPHAGE) 500 MG tablet; TAKE 1 TABLET BY MOUTH EVERY DAY WITH BREAKFAST  Dispense: 90 tablet; Refill: 1 - HgB A1c

## 2022-05-10 NOTE — Addendum Note (Signed)
Addended by: Everitt Amber on: 05/10/2022 08:48 AM   Modules accepted: Orders

## 2022-05-11 LAB — HEMOGLOBIN A1C
Est. average glucose Bld gHb Est-mCnc: 117 mg/dL
Hgb A1c MFr Bld: 5.7 % — ABNORMAL HIGH (ref 4.8–5.6)

## 2022-05-11 LAB — COMPREHENSIVE METABOLIC PANEL
ALT: 45 IU/L — ABNORMAL HIGH (ref 0–44)
AST: 49 IU/L — ABNORMAL HIGH (ref 0–40)
Albumin/Globulin Ratio: 1.8 (ref 1.2–2.2)
Albumin: 4.9 g/dL (ref 3.8–4.9)
Alkaline Phosphatase: 124 IU/L — ABNORMAL HIGH (ref 44–121)
BUN/Creatinine Ratio: 9 (ref 9–20)
BUN: 11 mg/dL (ref 6–24)
Bilirubin Total: 0.7 mg/dL (ref 0.0–1.2)
CO2: 27 mmol/L (ref 20–29)
Calcium: 10.1 mg/dL (ref 8.7–10.2)
Chloride: 98 mmol/L (ref 96–106)
Creatinine, Ser: 1.2 mg/dL (ref 0.76–1.27)
Globulin, Total: 2.7 g/dL (ref 1.5–4.5)
Glucose: 146 mg/dL — ABNORMAL HIGH (ref 70–99)
Potassium: 4.7 mmol/L (ref 3.5–5.2)
Sodium: 143 mmol/L (ref 134–144)
Total Protein: 7.6 g/dL (ref 6.0–8.5)
eGFR: 72 mL/min/{1.73_m2} (ref >59)

## 2022-05-11 LAB — LIPID PANEL WITH LDL/HDL RATIO
Cholesterol, Total: 165 mg/dL (ref 100–199)
HDL: 37 mg/dL — ABNORMAL LOW (ref >39)
LDL Chol Calc (NIH): 107 mg/dL — ABNORMAL HIGH (ref 0–99)
LDL/HDL Ratio: 2.9 ratio (ref 0.0–3.6)
Triglycerides: 113 mg/dL (ref 0–149)
VLDL Cholesterol Cal: 21 mg/dL (ref 5–40)

## 2022-05-19 DIAGNOSIS — G4733 Obstructive sleep apnea (adult) (pediatric): Secondary | ICD-10-CM | POA: Diagnosis not present

## 2022-07-26 DIAGNOSIS — G4733 Obstructive sleep apnea (adult) (pediatric): Secondary | ICD-10-CM | POA: Diagnosis not present

## 2022-08-26 DIAGNOSIS — G4733 Obstructive sleep apnea (adult) (pediatric): Secondary | ICD-10-CM | POA: Diagnosis not present

## 2022-09-10 ENCOUNTER — Ambulatory Visit: Payer: BC Managed Care – PPO | Admitting: Family Medicine

## 2022-09-14 ENCOUNTER — Encounter: Payer: Self-pay | Admitting: Family Medicine

## 2022-09-14 ENCOUNTER — Ambulatory Visit (INDEPENDENT_AMBULATORY_CARE_PROVIDER_SITE_OTHER): Payer: BC Managed Care – PPO | Admitting: Family Medicine

## 2022-09-14 VITALS — BP 138/80 | HR 64 | Ht 73.0 in | Wt 281.0 lb

## 2022-09-14 DIAGNOSIS — Z23 Encounter for immunization: Secondary | ICD-10-CM

## 2022-09-14 DIAGNOSIS — E119 Type 2 diabetes mellitus without complications: Secondary | ICD-10-CM

## 2022-09-14 DIAGNOSIS — E782 Mixed hyperlipidemia: Secondary | ICD-10-CM

## 2022-09-14 DIAGNOSIS — I10 Essential (primary) hypertension: Secondary | ICD-10-CM

## 2022-09-14 MED ORDER — HYDROCHLOROTHIAZIDE 12.5 MG PO CAPS: ORAL_CAPSULE | ORAL | 1 refills | Status: DC

## 2022-09-14 MED ORDER — ATORVASTATIN CALCIUM 10 MG PO TABS: 10.0000 mg | ORAL_TABLET | Freq: Every day | ORAL | 1 refills | Status: DC

## 2022-09-14 MED ORDER — LISINOPRIL-HYDROCHLOROTHIAZIDE 10-12.5 MG PO TABS: 1.0000 | ORAL_TABLET | Freq: Every day | ORAL | 1 refills | Status: DC

## 2022-09-14 MED ORDER — METFORMIN HCL 500 MG PO TABS: ORAL_TABLET | ORAL | 1 refills | Status: DC

## 2022-09-14 NOTE — Progress Notes (Signed)
Date:  09/14/2022   Name:  Adam Olson   DOB:  May 11, 1966   MRN:  [id number]   Chief Complaint: prediabetic, Hypertension, Hyperlipidemia, and shingles vaccine  Hypertension This is a chronic problem. The current episode started more than 1 year ago. The problem has been gradually improving since onset. The problem is controlled. Pertinent negatives include no anxiety, blurred vision, chest pain, headaches, malaise/fatigue, neck pain, orthopnea, palpitations, peripheral edema, PND, shortness of breath or sweats. There are no associated agents to hypertension. Risk factors for coronary artery disease include diabetes mellitus and dyslipidemia. Past treatments include ACE inhibitors and diuretics. The current treatment provides moderate improvement. There are no compliance problems.  There is no history of angina, kidney disease, CAD/MI, CVA, heart failure, left ventricular hypertrophy, PVD or retinopathy. There is no history of chronic renal disease, a hypertension causing med or renovascular disease.  Hyperlipidemia This is a chronic problem. The current episode started more than 1 year ago. The problem is controlled. Recent lipid tests were reviewed and are normal. He has no history of chronic renal disease. There are no known factors aggravating his hyperlipidemia. Pertinent negatives include no chest pain, myalgias or shortness of breath. Current antihyperlipidemic treatment includes statins. The current treatment provides moderate improvement of lipids. There are no compliance problems.  Risk factors for coronary artery disease include diabetes mellitus, dyslipidemia, hypertension and male sex.  Diabetes He presents for his follow-up diabetic visit. He has type 2 diabetes mellitus. There are no hypoglycemic associated symptoms. Pertinent negatives for hypoglycemia include no dizziness, headaches, nervousness/anxiousness or sweats. Pertinent negatives for diabetes include no blurred vision, no  chest pain, no polydipsia and no polyuria. There are no hypoglycemic complications. Symptoms are improving. There are no diabetic complications. Pertinent negatives for diabetic complications include no CVA, PVD or retinopathy. Current diabetic treatment includes oral agent (monotherapy). He is compliant with treatment all of the time.    Lab Results  Component Value Date   NA 143 05/10/2022   K 4.7 05/10/2022   CO2 27 05/10/2022   GLUCOSE 146 (H) 05/10/2022   BUN 11 05/10/2022   CREATININE 1.20 05/10/2022   CALCIUM 10.1 05/10/2022   EGFR 72 05/10/2022   GFRNONAA 82 09/30/2020   Lab Results  Component Value Date   CHOL 165 05/10/2022   HDL 37 (L) 05/10/2022   LDLCALC 107 (H) 05/10/2022   TRIG 113 05/10/2022   CHOLHDL 6.3 (H) 05/22/2016   No results found for: "TSH" Lab Results  Component Value Date   HGBA1C 5.7 (H) 05/10/2022   Lab Results  Component Value Date   WBC 7.8 09/30/2020   HGB 13.6 09/30/2020   HCT 41.1 09/30/2020   MCV 82 09/30/2020   PLT 180 09/30/2020   Lab Results  Component Value Date   ALT 45 (H) 05/10/2022   AST 49 (H) 05/10/2022   ALKPHOS 124 (H) 05/10/2022   BILITOT 0.7 05/10/2022   No results found for: "25OHVITD2", "25OHVITD3", "VD25OH"   Review of Systems  Constitutional:  Negative for chills, fever and malaise/fatigue.  HENT:  Negative for drooling, ear discharge, ear pain and sore throat.   Eyes:  Negative for blurred vision.  Respiratory:  Negative for cough, shortness of breath and wheezing.   Cardiovascular:  Negative for chest pain, palpitations, orthopnea, leg swelling and PND.  Gastrointestinal:  Negative for abdominal pain, blood in stool, constipation, diarrhea and nausea.  Endocrine: Negative for polydipsia and polyuria.  Genitourinary:  Negative for dysuria,  frequency, hematuria and urgency.  Musculoskeletal:  Negative for back pain, myalgias and neck pain.  Skin:  Negative for rash.  Allergic/Immunologic: Negative for  environmental allergies.  Neurological:  Negative for dizziness and headaches.  Hematological:  Does not bruise/bleed easily.  Psychiatric/Behavioral:  Negative for suicidal ideas. The patient is not nervous/anxious.     Patient Active Problem List   Diagnosis Date Noted   Mixed hyperlipidemia 09/11/2021   Essential hypertension 09/11/2021   Routine general medical examination at a health care facility 05/02/2015   Dermatitis, eczematoid 05/02/2015   Blood pressure elevated 05/02/2015   Adiposity 05/02/2015    No Known Allergies  Past Surgical History:  Procedure Laterality Date   LIPOMA EXCISION     scapula   RECONSTRUCTION OF EYELID      Social History   Tobacco Use   Smoking status: Every Day    Types: Cigars   Smokeless tobacco: Never  Substance Use Topics   Alcohol use: Yes    Alcohol/week: 0.0 standard drinks of alcohol   Drug use: No     Medication list has been reviewed and updated.  Current Meds  Medication Sig   atorvastatin (LIPITOR) 10 MG tablet Take 1 tablet (10 mg total) by mouth daily.   clotrimazole-betamethasone (LOTRISONE) cream APPLY TOPICALLY TO AFFECTED AREA EVERY DAY   hydrochlorothiazide (MICROZIDE) 12.5 MG capsule TAKE 1 CAPSULE BY MOUTH EVERY DAY   lisinopril-hydrochlorothiazide (ZESTORETIC) 10-12.5 MG tablet Take 1 tablet by mouth daily.   metFORMIN (GLUCOPHAGE) 500 MG tablet TAKE 1 TABLET BY MOUTH EVERY DAY WITH BREAKFAST   mupirocin ointment (BACTROBAN) 2 % Apply 1 application topically 2 (two) times daily.   tadalafil (CIALIS) 5 MG tablet Take 1 tablet (5 mg total) by mouth daily as needed for erectile dysfunction.       09/14/2022    8:10 AM 05/10/2022    8:17 AM 09/11/2021    9:55 AM 03/29/2021   10:28 AM  GAD 7 : Generalized Anxiety Score  Nervous, Anxious, on Edge 0 0 0 0  Control/stop worrying 0 0 0 0  Worry too much - different things 0 0 0 0  Trouble relaxing 0 0 0 0  Restless 0 0 0 0  Easily annoyed or irritable 0 0 0 0   Afraid - awful might happen 0 0 0 0  Total GAD 7 Score 0 0 0 0  Anxiety Difficulty Not difficult at all Not difficult at all         09/14/2022    8:10 AM 05/10/2022    8:17 AM 09/11/2021    9:55 AM  Depression screen PHQ 2/9  Decreased Interest 0 0 0  Down, Depressed, Hopeless 0 0 0  PHQ - 2 Score 0 0 0  Altered sleeping 0 0 0  Tired, decreased energy 0 0 0  Change in appetite 0 0 0  Feeling bad or failure about yourself  0 0 0  Trouble concentrating 0 0 0  Moving slowly or fidgety/restless 0 0 0  Suicidal thoughts 0 0 0  PHQ-9 Score 0 0 0  Difficult doing work/chores Not difficult at all Not difficult at all     BP Readings from Last 3 Encounters:  09/14/22 138/80  05/10/22 138/88  01/10/22 136/86    Physical Exam Vitals and nursing note reviewed.  HENT:     Head: Normocephalic.     Right Ear: External ear normal.     Left Ear: External ear normal.  Nose: Nose normal.     Mouth/Throat:     Mouth: Mucous membranes are moist.  Eyes:     General: No scleral icterus.    Conjunctiva/sclera: Conjunctivae normal.     Pupils: Pupils are equal, round, and reactive to light.  Neck:     Thyroid: No thyromegaly.     Vascular: No JVD.     Trachea: No tracheal deviation.  Cardiovascular:     Rate and Rhythm: Normal rate and regular rhythm.     Heart sounds: Normal heart sounds. No murmur heard.    No friction rub. No gallop.  Pulmonary:     Effort: No respiratory distress.     Breath sounds: Normal breath sounds. No wheezing or rales.  Abdominal:     General: Bowel sounds are normal.     Palpations: Abdomen is soft.     Tenderness: There is no abdominal tenderness. There is no guarding or rebound.  Musculoskeletal:        General: No tenderness. Normal range of motion.     Cervical back: Normal range of motion and neck supple.  Lymphadenopathy:     Cervical: No cervical adenopathy.  Skin:    General: Skin is warm.     Findings: No rash.  Neurological:      Mental Status: He is alert.     Wt Readings from Last 3 Encounters:  09/14/22 281 lb (127.5 kg)  05/10/22 272 lb (123.4 kg)  01/10/22 278 lb (126.1 kg)    BP 138/80   Pulse 64   Ht $R'6\' 1"'CO$  (1.854 m)   Wt 281 lb (127.5 kg)   BMI 37.07 kg/m   Assessment and Plan:  1. Controlled type 2 diabetes mellitus without complication, without long-term current use of insulin (HCC) Chronic.  Controlled.  Stable.  Will check A1c and CMP for electrolytes and GFR.  We will also continue his metformin 500 mg once a day and will recheck in 6 months. - metFORMIN (GLUCOPHAGE) 500 MG tablet; TAKE 1 TABLET BY MOUTH EVERY DAY WITH BREAKFAST  Dispense: 90 tablet; Refill: 1 - HgB A1c - Comprehensive Metabolic Panel (CMET)  2. Mixed hyperlipidemia Chronic.  Controlled.  Stable.  Continue atorvastatin 10 mg once a day. - atorvastatin (LIPITOR) 10 MG tablet; Take 1 tablet (10 mg total) by mouth daily.  Dispense: 90 tablet; Refill: 1  3. Essential hypertension Chronic.  Controlled.  Stable.  Blood pressure 138/80.  Asymptomatic.  Tolerating medication regimen well.  Therefore we will continue hydrochlorothiazide 12.5 mg and lisinopril hydrochlorothiazide 10-12.5 mg.  We will recheck in 6 months. - hydrochlorothiazide (MICROZIDE) 12.5 MG capsule; TAKE 1 CAPSULE BY MOUTH EVERY DAY  Dispense: 90 capsule; Refill: 1 - lisinopril-hydrochlorothiazide (ZESTORETIC) 10-12.5 MG tablet; Take 1 tablet by mouth daily.  Dispense: 90 tablet; Refill: 1 - Comprehensive Metabolic Panel (CMET)    Otilio Miu, MD

## 2022-09-15 LAB — COMPREHENSIVE METABOLIC PANEL
ALT: 40 IU/L (ref 0–44)
AST: 48 IU/L — ABNORMAL HIGH (ref 0–40)
Albumin/Globulin Ratio: 1.7 (ref 1.2–2.2)
Albumin: 4.6 g/dL (ref 3.8–4.9)
Alkaline Phosphatase: 119 IU/L (ref 44–121)
BUN/Creatinine Ratio: 8 — ABNORMAL LOW (ref 9–20)
BUN: 10 mg/dL (ref 6–24)
Bilirubin Total: 0.9 mg/dL (ref 0.0–1.2)
CO2: 25 mmol/L (ref 20–29)
Calcium: 9.8 mg/dL (ref 8.7–10.2)
Chloride: 99 mmol/L (ref 96–106)
Creatinine, Ser: 1.21 mg/dL (ref 0.76–1.27)
Globulin, Total: 2.7 g/dL (ref 1.5–4.5)
Glucose: 169 mg/dL — ABNORMAL HIGH (ref 70–99)
Potassium: 5 mmol/L (ref 3.5–5.2)
Sodium: 141 mmol/L (ref 134–144)
Total Protein: 7.3 g/dL (ref 6.0–8.5)
eGFR: 71 mL/min/{1.73_m2} (ref >59)

## 2022-09-15 LAB — HEMOGLOBIN A1C
Est. average glucose Bld gHb Est-mCnc: 140 mg/dL
Hgb A1c MFr Bld: 6.5 % — ABNORMAL HIGH (ref 4.8–5.6)

## 2022-09-17 ENCOUNTER — Other Ambulatory Visit: Payer: Self-pay

## 2022-09-17 DIAGNOSIS — E119 Type 2 diabetes mellitus without complications: Secondary | ICD-10-CM

## 2022-09-17 MED ORDER — METFORMIN HCL 500 MG PO TABS: ORAL_TABLET | ORAL | 0 refills | Status: DC

## 2022-09-25 DIAGNOSIS — G4733 Obstructive sleep apnea (adult) (pediatric): Secondary | ICD-10-CM | POA: Diagnosis not present

## 2022-10-09 DIAGNOSIS — Z20822 Contact with and (suspected) exposure to covid-19: Secondary | ICD-10-CM | POA: Diagnosis not present

## 2022-10-09 DIAGNOSIS — J029 Acute pharyngitis, unspecified: Secondary | ICD-10-CM | POA: Diagnosis not present

## 2022-11-06 DIAGNOSIS — R490 Dysphonia: Secondary | ICD-10-CM | POA: Diagnosis not present

## 2022-11-06 DIAGNOSIS — K219 Gastro-esophageal reflux disease without esophagitis: Secondary | ICD-10-CM | POA: Diagnosis not present

## 2022-11-06 DIAGNOSIS — G4733 Obstructive sleep apnea (adult) (pediatric): Secondary | ICD-10-CM | POA: Diagnosis not present

## 2022-12-03 DIAGNOSIS — G4733 Obstructive sleep apnea (adult) (pediatric): Secondary | ICD-10-CM | POA: Diagnosis not present

## 2022-12-03 DIAGNOSIS — Z9989 Dependence on other enabling machines and devices: Secondary | ICD-10-CM | POA: Diagnosis not present

## 2023-02-06 DIAGNOSIS — G4733 Obstructive sleep apnea (adult) (pediatric): Secondary | ICD-10-CM | POA: Diagnosis not present

## 2023-03-07 DIAGNOSIS — G4733 Obstructive sleep apnea (adult) (pediatric): Secondary | ICD-10-CM | POA: Diagnosis not present

## 2023-03-15 ENCOUNTER — Encounter: Payer: Self-pay | Admitting: Family Medicine

## 2023-03-15 ENCOUNTER — Ambulatory Visit (INDEPENDENT_AMBULATORY_CARE_PROVIDER_SITE_OTHER): Payer: BC Managed Care – PPO | Admitting: Family Medicine

## 2023-03-15 VITALS — BP 110/64 | HR 96 | Ht 73.0 in | Wt 266.0 lb

## 2023-03-15 DIAGNOSIS — E119 Type 2 diabetes mellitus without complications: Secondary | ICD-10-CM | POA: Diagnosis not present

## 2023-03-15 DIAGNOSIS — I10 Essential (primary) hypertension: Secondary | ICD-10-CM | POA: Diagnosis not present

## 2023-03-15 DIAGNOSIS — E782 Mixed hyperlipidemia: Secondary | ICD-10-CM | POA: Diagnosis not present

## 2023-03-15 MED ORDER — METFORMIN HCL 500 MG PO TABS: ORAL_TABLET | ORAL | 1 refills | Status: DC

## 2023-03-15 MED ORDER — ATORVASTATIN CALCIUM 10 MG PO TABS: 10.0000 mg | ORAL_TABLET | Freq: Every day | ORAL | 1 refills | Status: DC

## 2023-03-15 MED ORDER — LISINOPRIL-HYDROCHLOROTHIAZIDE 10-12.5 MG PO TABS: 1.0000 | ORAL_TABLET | Freq: Every day | ORAL | 1 refills | Status: DC

## 2023-03-15 MED ORDER — HYDROCHLOROTHIAZIDE 12.5 MG PO CAPS: ORAL_CAPSULE | ORAL | 1 refills | Status: DC

## 2023-03-15 NOTE — Progress Notes (Signed)
Date:  03/15/2023   Name:  Adam Olson   DOB:  [birth year]   MRN:  [id number]   Chief Complaint: Hypertension, Hyperlipidemia, and Diabetes  Hypertension This is a chronic problem. The current episode started more than 1 year ago. The problem has been waxing and waning since onset. The problem is controlled. Pertinent negatives include no anxiety, blurred vision, chest pain, headaches, malaise/fatigue, neck pain, orthopnea, palpitations, peripheral edema, PND, shortness of breath or sweats. There are no associated agents to hypertension. There are no known risk factors for coronary artery disease. Past treatments include ACE inhibitors and diuretics. The current treatment provides moderate improvement. There are no compliance problems.  There is no history of angina, kidney disease, CAD/MI or CVA. There is no history of chronic renal disease, a hypertension causing med or renovascular disease.  Hyperlipidemia This is a chronic problem. The current episode started more than 1 year ago. The problem is controlled. Recent lipid tests were reviewed and are normal. He has no history of chronic renal disease or diabetes. There are no known factors aggravating his hyperlipidemia. Pertinent negatives include no chest pain, myalgias or shortness of breath. Current antihyperlipidemic treatment includes statins. The current treatment provides moderate improvement of lipids. There are no compliance problems.  There are no known risk factors for coronary artery disease.  Diabetes He presents for his follow-up diabetic visit. He has type 2 diabetes mellitus. His disease course has been stable. There are no hypoglycemic associated symptoms. Pertinent negatives for hypoglycemia include no dizziness, headaches, nervousness/anxiousness or sweats. Pertinent negatives for diabetes include no blurred vision, no chest pain, no fatigue, no foot paresthesias, no foot ulcerations, no polydipsia, no polyphagia, no polyuria,  no visual change, no weakness and no weight loss. There are no hypoglycemic complications. Symptoms are stable. There are no diabetic complications. Pertinent negatives for diabetic complications include no CVA. Risk factors for coronary artery disease include dyslipidemia and hypertension. He is following a generally healthy diet. Meal planning includes avoidance of concentrated sweets and carbohydrate counting. An ACE inhibitor/angiotensin II receptor blocker is being taken.    Lab Results  Component Value Date   NA 141 09/14/2022   K 5.0 09/14/2022   CO2 25 09/14/2022   GLUCOSE 169 (H) 09/14/2022   BUN 10 09/14/2022   CREATININE 1.21 09/14/2022   CALCIUM 9.8 09/14/2022   EGFR 71 09/14/2022   GFRNONAA 82 09/30/2020   Lab Results  Component Value Date   CHOL 165 05/10/2022   HDL 37 (L) 05/10/2022   LDLCALC 107 (H) 05/10/2022   TRIG 113 05/10/2022   CHOLHDL 6.3 (H) 05/22/2016   No results found for: "TSH" Lab Results  Component Value Date   HGBA1C 6.5 (H) 09/14/2022   Lab Results  Component Value Date   WBC 7.8 09/30/2020   HGB 13.6 09/30/2020   HCT 41.1 09/30/2020   MCV 82 09/30/2020   PLT 180 09/30/2020   Lab Results  Component Value Date   ALT 40 09/14/2022   AST 48 (H) 09/14/2022   ALKPHOS 119 09/14/2022   BILITOT 0.9 09/14/2022   No results found for: "25OHVITD2", "25OHVITD3", "VD25OH"   Review of Systems  Constitutional:  Negative for chills, fatigue, fever, malaise/fatigue and weight loss.  HENT:  Negative for drooling, ear discharge, ear pain and sore throat.   Eyes:  Negative for blurred vision.  Respiratory:  Negative for cough, shortness of breath and wheezing.   Cardiovascular:  Negative for chest pain, palpitations, orthopnea,  leg swelling and PND.  Gastrointestinal:  Negative for abdominal pain, blood in stool, constipation, diarrhea and nausea.  Endocrine: Negative for polydipsia, polyphagia and polyuria.  Genitourinary:  Negative for dysuria,  frequency, hematuria and urgency.  Musculoskeletal:  Negative for back pain, myalgias and neck pain.  Skin:  Negative for rash.  Allergic/Immunologic: Negative for environmental allergies.  Neurological:  Negative for dizziness, weakness and headaches.  Hematological:  Does not bruise/bleed easily.  Psychiatric/Behavioral:  Negative for suicidal ideas. The patient is not nervous/anxious.     Patient Active Problem List   Diagnosis Date Noted   Mixed hyperlipidemia 09/11/2021   Essential hypertension 09/11/2021   Routine general medical examination at a health care facility 05/02/2015   Dermatitis, eczematoid 05/02/2015   Blood pressure elevated 05/02/2015   Adiposity 05/02/2015    No Known Allergies  Past Surgical History:  Procedure Laterality Date   LIPOMA EXCISION     scapula   RECONSTRUCTION OF EYELID      Social History   Tobacco Use   Smoking status: Every Day    Types: Cigars   Smokeless tobacco: Never  Substance Use Topics   Alcohol use: Yes    Alcohol/week: 0.0 standard drinks of alcohol   Drug use: No     Medication list has been reviewed and updated.  Current Meds  Medication Sig   atorvastatin (LIPITOR) 10 MG tablet Take 1 tablet (10 mg total) by mouth daily.   clotrimazole-betamethasone (LOTRISONE) cream APPLY TOPICALLY TO AFFECTED AREA EVERY DAY   hydrochlorothiazide (MICROZIDE) 12.5 MG capsule TAKE 1 CAPSULE BY MOUTH EVERY DAY   lisinopril-hydrochlorothiazide (ZESTORETIC) 10-12.5 MG tablet Take 1 tablet by mouth daily.   metFORMIN (GLUCOPHAGE) 500 MG tablet TAKE 1 TABLET BY MOUTH twice a day   mupirocin ointment (BACTROBAN) 2 % Apply 1 application topically 2 (two) times daily.   tadalafil (CIALIS) 5 MG tablet Take 1 tablet (5 mg total) by mouth daily as needed for erectile dysfunction.       03/15/2023    8:05 AM 09/14/2022    8:10 AM 05/10/2022    8:17 AM 09/11/2021    9:55 AM  GAD 7 : Generalized Anxiety Score  Nervous, Anxious, on Edge 0 0 0  0  Control/stop worrying 0 0 0 0  Worry too much - different things 0 0 0 0  Trouble relaxing 0 0 0 0  Restless 0 0 0 0  Easily annoyed or irritable 0 0 0 0  Afraid - awful might happen 0 0 0 0  Total GAD 7 Score 0 0 0 0  Anxiety Difficulty Not difficult at all Not difficult at all Not difficult at all        03/15/2023    8:05 AM 09/14/2022    8:10 AM 05/10/2022    8:17 AM  Depression screen PHQ 2/9  Decreased Interest 0 0 0  Down, Depressed, Hopeless 0 0 0  PHQ - 2 Score 0 0 0  Altered sleeping 0 0 0  Tired, decreased energy 0 0 0  Change in appetite 0 0 0  Feeling bad or failure about yourself  0 0 0  Trouble concentrating 0 0 0  Moving slowly or fidgety/restless 0 0 0  Suicidal thoughts 0 0 0  PHQ-9 Score 0 0 0  Difficult doing work/chores Not difficult at all Not difficult at all Not difficult at all    BP Readings from Last 3 Encounters:  03/15/23 110/64  09/14/22 138/80  05/10/22 138/88    Physical Exam Vitals and nursing note reviewed.  HENT:     Head: Normocephalic.     Right Ear: External ear normal.     Left Ear: External ear normal.     Nose: Nose normal. No congestion or rhinorrhea.     Mouth/Throat:     Mouth: Mucous membranes are moist.     Pharynx: No oropharyngeal exudate or posterior oropharyngeal erythema.  Eyes:     General: No scleral icterus.       Right eye: No discharge.        Left eye: No discharge.     Conjunctiva/sclera: Conjunctivae normal.     Pupils: Pupils are equal, round, and reactive to light.  Neck:     Thyroid: No thyromegaly.     Vascular: No JVD.     Trachea: No tracheal deviation.  Cardiovascular:     Rate and Rhythm: Normal rate and regular rhythm.     Heart sounds: Normal heart sounds. No murmur heard.    No friction rub. No gallop.  Pulmonary:     Effort: No respiratory distress.     Breath sounds: Normal breath sounds. No wheezing, rhonchi or rales.  Abdominal:     General: Bowel sounds are normal.      Palpations: Abdomen is soft. There is no mass.     Tenderness: There is no abdominal tenderness. There is no guarding or rebound.  Musculoskeletal:        General: No tenderness. Normal range of motion.     Cervical back: Normal range of motion and neck supple.  Lymphadenopathy:     Cervical: No cervical adenopathy.  Skin:    General: Skin is warm.     Findings: No rash.  Neurological:     Mental Status: He is alert.     Wt Readings from Last 3 Encounters:  03/15/23 266 lb (120.7 kg)  09/14/22 281 lb (127.5 kg)  05/10/22 272 lb (123.4 kg)    BP 110/64   Pulse 96   Ht 6\' 1"  (1.854 m)   Wt 266 lb (120.7 kg)   SpO2 95%   BMI 35.09 kg/m   Assessment and Plan:  1. Essential hypertension Chronic.  Controlled.  Stable.  Blood pressure today is 110/64.  Continue lisinopril hydrochlorothiazide 10-12.5 mg and hydrochlorothiazide 12.5 mg for total of 25 mg.  Asymptomatic.  Tolerating medications well.  Will check renal function panel for electrolytes and GFR.  Will recheck in 4 months for A1c. - hydrochlorothiazide (MICROZIDE) 12.5 MG capsule; TAKE 1 CAPSULE BY MOUTH EVERY DAY  Dispense: 90 capsule; Refill: 1 - lisinopril-hydrochlorothiazide (ZESTORETIC) 10-12.5 MG tablet; Take 1 tablet by mouth daily.  Dispense: 90 tablet; Refill: 1 - Renal Function Panel  2. Mixed hyperlipidemia .  Controlled.  Stable.  Continue atorvastatin 10 mg once a day.  Will check lipid panel for current level of control of LDL. - atorvastatin (LIPITOR) 10 MG tablet; Take 1 tablet (10 mg total) by mouth daily.  Dispense: 90 tablet; Refill: 1 - Lipid Panel With LDL/HDL Ratio  3. Controlled type 2 diabetes mellitus without complication, without long-term current use of insulin (HCC) Chronic.  Controlled.  Stable.  Continue metformin 500 mg twice a day.  Will check A1c with renal function panel.  Will also check microalbuminuria with creatinine urine ratio for current surveillance of diabetic nephropathy. -  metFORMIN (GLUCOPHAGE) 500 MG tablet; TAKE 1 TABLET BY MOUTH twice a day  Dispense: 180  tablet; Refill: 1 - HgB A1c - Renal Function Panel - Microalbumin / creatinine urine ratio    Otilio Miu, MD

## 2023-03-21 DIAGNOSIS — G4733 Obstructive sleep apnea (adult) (pediatric): Secondary | ICD-10-CM | POA: Diagnosis not present

## 2023-04-02 ENCOUNTER — Other Ambulatory Visit: Payer: Self-pay | Admitting: Family Medicine

## 2023-04-02 DIAGNOSIS — E119 Type 2 diabetes mellitus without complications: Secondary | ICD-10-CM

## 2023-04-03 NOTE — Telephone Encounter (Signed)
Refilled 03/15/23 #[id number] with 1 refill. Requested Prescriptions  Refused Prescriptions Disp Refills   metFORMIN (GLUCOPHAGE) 500 MG tablet [Pharmacy Med Name: METFORMIN HCL 500 MG TABLET] 90 tablet 1    Sig: TAKE 1 TABLET BY MOUTH EVERY DAY WITH BREAKFAST     Endocrinology:  Diabetes - Biguanides Failed - 04/02/2023  3:10 AM      Failed - HBA1C is between 0 and 7.9 and within 180 days    Hgb A1c MFr Bld  Date Value Ref Range Status  09/14/2022 6.5 (H) 4.8 - 5.6 % Final    Comment:             Prediabetes: 5.7 - 6.4          Diabetes: >6.4          Glycemic control for adults with diabetes: <7.0          Failed - B12 Level in normal range and within 720 days    No results found for: "VITAMINB12"       Failed - CBC within normal limits and completed in the last 12 months    WBC  Date Value Ref Range Status  09/30/2020 7.8 3.4 - 10.8 x10E3/uL Final   RBC  Date Value Ref Range Status  09/30/2020 5.04 4.14 - 5.80 x10E6/uL Final   Hemoglobin  Date Value Ref Range Status  09/30/2020 13.6 13.0 - 17.7 g/dL Final   Hematocrit  Date Value Ref Range Status  09/30/2020 41.1 37.5 - 51.0 % Final   MCHC  Date Value Ref Range Status  09/30/2020 33.1 31.5 - 35.7 g/dL Final   [facility]  Date Value Ref Range Status  09/30/2020 27.0 26.6 - 33.0 pg Final   MCV  Date Value Ref Range Status  09/30/2020 82 79 - 97 fL Final   No results found for: "PLTCOUNTKUC", "LABPLAT", "POCPLA" RDW  Date Value Ref Range Status  09/30/2020 16.0 (H) 11.6 - 15.4 % Final         Passed - Cr in normal range and within 360 days    Creatinine, Ser  Date Value Ref Range Status  09/14/2022 1.21 0.76 - 1.27 mg/dL Final         Passed - eGFR in normal range and within 360 days    GFR calc Af Amer  Date Value Ref Range Status  09/30/2020 94 >59 mL/min/1.73 Final    Comment:    **In accordance with recommendations from the NKF-ASN Task force,**   [facility] is in the process of updating its eGFR calculation to  the   2021 CKD-EPI creatinine equation that estimates kidney function   without a race variable.    GFR calc non Af Amer  Date Value Ref Range Status  09/30/2020 82 >59 mL/min/1.73 Final   eGFR  Date Value Ref Range Status  09/14/2022 71 >59 mL/min/1.73 Final         Passed - Valid encounter within last 6 months    Recent Outpatient Visits           2 weeks ago Essential hypertension   [place] [facility], MD   6 months ago Controlled type 2 diabetes mellitus without complication, without long-term current use of insulin (HCC)   [place] [facility] at [facility], MD   10 months ago Controlled type 2 diabetes mellitus without complication, without long-term current use of insulin (HCC)  [facility], MD   1 year ago Controlled type 2 diabetes mellitus without complication, without long-term current use of insulin [facility])   [place] [facility], MD   1 year ago Essential hypertension   [place] [facility], MD       Future Appointments             In 3 months Duanne Limerick, MD [facility] at [facility], [facility]

## 2023-04-04 ENCOUNTER — Telehealth: Payer: Self-pay

## 2023-04-04 NOTE — Telephone Encounter (Signed)
Called pt and left message- needs labs drawn from last visit

## 2023-04-18 LAB — MICROALBUMIN / CREATININE URINE RATIO
Creatinine, Urine: 264.2 mg/dL
Microalb/Creat Ratio: 20 mg/g creat (ref 0–29)
Microalbumin, Urine: 52.1 ug/mL

## 2023-04-18 LAB — HEMOGLOBIN A1C
Est. average glucose Bld gHb Est-mCnc: 137 mg/dL
Hgb A1c MFr Bld: 6.4 % — ABNORMAL HIGH (ref 4.8–5.6)

## 2023-04-18 LAB — RENAL FUNCTION PANEL
Albumin: 4.6 g/dL (ref 3.8–4.9)
BUN/Creatinine Ratio: 10 (ref 9–20)
BUN: 12 mg/dL (ref 6–24)
CO2: 24 mmol/L (ref 20–29)
Calcium: 9.8 mg/dL (ref 8.7–10.2)
Chloride: 99 mmol/L (ref 96–106)
Creatinine, Ser: 1.2 mg/dL (ref 0.76–1.27)
Glucose: 125 mg/dL — ABNORMAL HIGH (ref 70–99)
Phosphorus: 4.5 mg/dL — ABNORMAL HIGH (ref 2.8–4.1)
Potassium: 4.6 mmol/L (ref 3.5–5.2)
Sodium: 141 mmol/L (ref 134–144)
eGFR: 71 mL/min/{1.73_m2} (ref >59)

## 2023-04-18 LAB — LIPID PANEL WITH LDL/HDL RATIO
Cholesterol, Total: 163 mg/dL (ref 100–199)
HDL: 40 mg/dL (ref >39)
LDL Chol Calc (NIH): 107 mg/dL — ABNORMAL HIGH (ref 0–99)
LDL/HDL Ratio: 2.7 ratio (ref 0.0–3.6)
Triglycerides: 86 mg/dL (ref 0–149)
VLDL Cholesterol Cal: 16 mg/dL (ref 5–40)

## 2023-07-19 ENCOUNTER — Ambulatory Visit (INDEPENDENT_AMBULATORY_CARE_PROVIDER_SITE_OTHER): Payer: 59 | Admitting: Family Medicine

## 2023-07-19 ENCOUNTER — Encounter: Payer: Self-pay | Admitting: Family Medicine

## 2023-07-19 VITALS — BP 122/78 | HR 95 | Ht 73.0 in | Wt 264.0 lb

## 2023-07-19 DIAGNOSIS — Z7984 Long term (current) use of oral hypoglycemic drugs: Secondary | ICD-10-CM

## 2023-07-19 DIAGNOSIS — E119 Type 2 diabetes mellitus without complications: Secondary | ICD-10-CM

## 2023-07-19 MED ORDER — METFORMIN HCL 500 MG PO TABS: ORAL_TABLET | ORAL | 1 refills | Status: DC

## 2023-07-19 NOTE — Progress Notes (Signed)
Date:  07/19/2023   Name:  Adam Olson   DOB:  1966/04/17   MRN:  [id number]   Chief Complaint: Diabetes  Diabetes He presents for his follow-up diabetic visit. He has type 2 diabetes mellitus. His disease course has been stable. There are no hypoglycemic associated symptoms. There are no diabetic associated symptoms. Pertinent negatives for diabetes include no chest pain, no fatigue, no polydipsia and no polyuria. There are no hypoglycemic complications. Symptoms are stable. There are no diabetic complications. There are no known risk factors for coronary artery disease. Current diabetic treatment includes oral agent (monotherapy). He is following a generally healthy diet. Meal planning includes avoidance of concentrated sweets and carbohydrate counting. He participates in exercise intermittently. Eye exam is not current.    Lab Results  Component Value Date   NA 141 04/17/2023   K 4.6 04/17/2023   CO2 24 04/17/2023   GLUCOSE 125 (H) 04/17/2023   BUN 12 04/17/2023   CREATININE 1.20 04/17/2023   CALCIUM 9.8 04/17/2023   EGFR 71 04/17/2023   GFRNONAA 82 09/30/2020   Lab Results  Component Value Date   CHOL 163 04/17/2023   HDL 40 04/17/2023   LDLCALC 107 (H) 04/17/2023   TRIG 86 04/17/2023   CHOLHDL 6.3 (H) 05/22/2016   No results found for: "TSH" Lab Results  Component Value Date   HGBA1C 6.4 (H) 04/17/2023   Lab Results  Component Value Date   WBC 7.8 09/30/2020   HGB 13.6 09/30/2020   HCT 41.1 09/30/2020   MCV 82 09/30/2020   PLT 180 09/30/2020   Lab Results  Component Value Date   ALT 40 09/14/2022   AST 48 (H) 09/14/2022   ALKPHOS 119 09/14/2022   BILITOT 0.9 09/14/2022   No results found for: "25OHVITD2", "25OHVITD3", "VD25OH"   Review of Systems  Constitutional:  Negative for fatigue.  HENT:  Negative for trouble swallowing.   Eyes:  Negative for visual disturbance.  Respiratory:  Negative for choking, shortness of breath and wheezing.    Cardiovascular:  Negative for chest pain, palpitations and leg swelling.  Gastrointestinal:  Negative for abdominal pain and blood in stool.  Endocrine: Negative for polydipsia and polyuria.  Genitourinary:  Negative for difficulty urinating.    Patient Active Problem List   Diagnosis Date Noted   Mixed hyperlipidemia 09/11/2021   Essential hypertension 09/11/2021   Routine general medical examination at a health care facility 05/02/2015   Dermatitis, eczematoid 05/02/2015   Blood pressure elevated 05/02/2015   Adiposity 05/02/2015    No Known Allergies  Past Surgical History:  Procedure Laterality Date   LIPOMA EXCISION     scapula   RECONSTRUCTION OF EYELID      Social History   Tobacco Use   Smoking status: Every Day    Types: Cigars   Smokeless tobacco: Never  Substance Use Topics   Alcohol use: Yes    Alcohol/week: 0.0 standard drinks of alcohol   Drug use: No     Medication list has been reviewed and updated.  Current Meds  Medication Sig   atorvastatin (LIPITOR) 10 MG tablet Take 1 tablet (10 mg total) by mouth daily.   clotrimazole-betamethasone (LOTRISONE) cream APPLY TOPICALLY TO AFFECTED AREA EVERY DAY   hydrochlorothiazide (MICROZIDE) 12.5 MG capsule TAKE 1 CAPSULE BY MOUTH EVERY DAY   lisinopril-hydrochlorothiazide (ZESTORETIC) 10-12.5 MG tablet Take 1 tablet by mouth daily.   metFORMIN (GLUCOPHAGE) 500 MG tablet TAKE 1 TABLET BY MOUTH twice a  day   mupirocin ointment (BACTROBAN) 2 % Apply 1 application topically 2 (two) times daily.   tadalafil (CIALIS) 5 MG tablet Take 1 tablet (5 mg total) by mouth daily as needed for erectile dysfunction.       07/19/2023    7:59 AM 03/15/2023    8:05 AM 09/14/2022    8:10 AM 05/10/2022    8:17 AM  GAD 7 : Generalized Anxiety Score  Nervous, Anxious, on Edge 0 0 0 0  Control/stop worrying 0 0 0 0  Worry too much - different things 0 0 0 0  Trouble relaxing 0 0 0 0  Restless 0 0 0 0  Easily annoyed or  irritable 0 0 0 0  Afraid - awful might happen 0 0 0 0  Total GAD 7 Score 0 0 0 0  Anxiety Difficulty Not difficult at all Not difficult at all Not difficult at all Not difficult at all       07/19/2023    7:59 AM 03/15/2023    8:05 AM 09/14/2022    8:10 AM  Depression screen PHQ 2/9  Decreased Interest 0 0 0  Down, Depressed, Hopeless 0 0 0  PHQ - 2 Score 0 0 0  Altered sleeping 0 0 0  Tired, decreased energy 0 0 0  Change in appetite 0 0 0  Feeling bad or failure about yourself  0 0 0  Trouble concentrating 0 0 0  Moving slowly or fidgety/restless 0 0 0  Suicidal thoughts 0 0 0  PHQ-9 Score 0 0 0  Difficult doing work/chores Not difficult at all Not difficult at all Not difficult at all    BP Readings from Last 3 Encounters:  07/19/23 122/78  03/15/23 110/64  09/14/22 138/80    Physical Exam Vitals and nursing note reviewed.  HENT:     Head: Normocephalic.     Right Ear: Tympanic membrane and external ear normal.     Left Ear: Tympanic membrane and external ear normal.     Nose: Nose normal.     Mouth/Throat:     Mouth: Mucous membranes are moist.  Eyes:     General: No scleral icterus.       Right eye: No discharge.        Left eye: No discharge.     Conjunctiva/sclera: Conjunctivae normal.     Pupils: Pupils are equal, round, and reactive to light.  Neck:     Thyroid: No thyromegaly.     Vascular: No JVD.     Trachea: No tracheal deviation.  Cardiovascular:     Rate and Rhythm: Normal rate and regular rhythm.     Heart sounds: Normal heart sounds. No murmur heard.    No friction rub. No gallop.  Pulmonary:     Effort: No respiratory distress.     Breath sounds: Normal breath sounds. No wheezing, rhonchi or rales.  Abdominal:     Palpations: Abdomen is soft.  Musculoskeletal:        General: No tenderness.     Cervical back: Normal range of motion and neck supple.  Lymphadenopathy:     Cervical: No cervical adenopathy.  Skin:    General: Skin is warm.      Findings: No rash.  Neurological:     Mental Status: He is alert.     Wt Readings from Last 3 Encounters:  07/19/23 264 lb (119.7 kg)  03/15/23 266 lb (120.7 kg)  09/14/22 281 lb (127.5 kg)  BP 122/78   Pulse 95   Ht 6\' 1"  (1.854 m)   Wt 264 lb (119.7 kg)   SpO2 99%   BMI 34.83 kg/m   Assessment and Plan: 1. Controlled type 2 diabetes mellitus without complication, without long-term current use of insulin (HCC) Chronic.  Controlled.  Stable.  Continue metformin 500 mg twice a day.  Will check A1c for current level of control and recheck in 4 months. - metFORMIN (GLUCOPHAGE) 500 MG tablet; TAKE 1 TABLET BY MOUTH twice a day  Dispense: 180 tablet; Refill: 1 - HgB A1c     Elizabeth Sauer, MD

## 2023-11-07 ENCOUNTER — Other Ambulatory Visit: Payer: Self-pay | Admitting: Family Medicine

## 2023-11-07 DIAGNOSIS — I10 Essential (primary) hypertension: Secondary | ICD-10-CM

## 2023-12-03 LAB — HM DIABETES EYE EXAM

## 2023-12-06 ENCOUNTER — Other Ambulatory Visit: Payer: Self-pay | Admitting: Family Medicine

## 2023-12-06 DIAGNOSIS — E782 Mixed hyperlipidemia: Secondary | ICD-10-CM

## 2023-12-06 DIAGNOSIS — I10 Essential (primary) hypertension: Secondary | ICD-10-CM

## 2023-12-06 NOTE — Telephone Encounter (Signed)
Requested Prescriptions  Pending Prescriptions Disp Refills   lisinopril-hydrochlorothiazide (ZESTORETIC) 10-12.5 MG tablet [Pharmacy Med Name: LISINOPRIL-HCTZ 10-12.5 MG TAB] 90 tablet 1    Sig: TAKE 1 TABLET BY MOUTH EVERY DAY     Cardiovascular:  ACEI + Diuretic Combos Failed - 12/06/2023 12:23 PM      Failed - Na in normal range and within 180 days    Sodium  Date Value Ref Range Status  04/17/2023 141 134 - 144 mmol/L Final         Failed - K in normal range and within 180 days    Potassium  Date Value Ref Range Status  04/17/2023 4.6 3.5 - 5.2 mmol/L Final         Failed - Cr in normal range and within 180 days    Creatinine, Ser  Date Value Ref Range Status  04/17/2023 1.20 0.76 - 1.27 mg/dL Final         Failed - eGFR is 30 or above and within 180 days    GFR calc Af Amer  Date Value Ref Range Status  09/30/2020 94 >59 mL/min/1.73 Final    Comment:    **In accordance with recommendations from the NKF-ASN Task force,**   [facility] is in the process of updating its eGFR calculation to the   2021 CKD-EPI creatinine equation that estimates kidney function   without a race variable.    GFR calc non Af Amer  Date Value Ref Range Status  09/30/2020 82 >59 mL/min/1.73 Final   eGFR  Date Value Ref Range Status  04/17/2023 71 >59 mL/min/1.73 Final         Passed - Patient is not pregnant      Passed - Last BP in normal range    BP Readings from Last 1 Encounters:  07/19/23 122/78         Passed - Valid encounter within last 6 months    Recent Outpatient Visits           4 months ago Controlled type 2 diabetes mellitus without complication, without long-term current use of insulin (HCC)   [place] [facility], MD   8 months ago Essential hypertension   [place] [facility], MD   1 year ago Controlled type 2 diabetes mellitus without complication,  without long-term current use of insulin (HCC)   [place] [facility], MD   1 year ago Controlled type 2 diabetes mellitus without complication, without long-term current use of insulin (HCC)   [place] [facility] at [facility], MD   1 year ago Controlled type 2 diabetes mellitus without complication, without long-term current use of insulin (HCC)   [place] [facility] at [facility], MD               atorvastatin (LIPITOR) 10 MG tablet [Pharmacy Med Name: ATORVASTATIN 10 MG TABLET] 90 tablet 1    Sig: TAKE 1 TABLET BY MOUTH EVERY DAY     Cardiovascular:  Antilipid - Statins Failed - 12/06/2023 12:23 PM      Failed - Lipid Panel in normal range within the last 12 months    Cholesterol, Total  Date Value Ref Range Status  04/17/2023 163 100 - 199 mg/dL Final   LDL  Chol Calc (NIH)  Date Value Ref Range Status  04/17/2023 107 (H) 0 - 99 mg/dL Final   HDL  Date Value Ref Range Status  04/17/2023 40 >39 mg/dL Final   Triglycerides  Date Value Ref Range Status  04/17/2023 86 0 - 149 mg/dL Final         Passed - Patient is not pregnant      Passed - Valid encounter within last 12 months    Recent Outpatient Visits           4 months ago Controlled type 2 diabetes mellitus without complication, without long-term current use of insulin (HCC)   [place] [facility], MD   8 months ago Essential hypertension   [place] [facility] at [facility], MD   1 year ago Controlled type 2 diabetes mellitus without complication, without long-term current use of insulin (HCC)   [place] [facility] at [facility], MD   1 year ago Controlled type 2 diabetes mellitus without complication, without long-term  current use of insulin (HCC)   [place] [facility] at [facility], MD   1 year ago Controlled type 2 diabetes mellitus without complication, without long-term current use of insulin [facility])    [facility], MD

## 2023-12-06 NOTE — Telephone Encounter (Signed)
Requested medication (s) are due for refill today: yes  Requested medication (s) are on the active medication list: yes  Last refill:  03/15/23 #90/1  Future visit scheduled: no  Notes to clinic:  Unable to refill per protocol due to failed labs, no updated results.      Requested Prescriptions  Pending Prescriptions Disp Refills   lisinopril-hydrochlorothiazide (ZESTORETIC) 10-12.5 MG tablet [Pharmacy Med Name: LISINOPRIL-HCTZ 10-12.5 MG TAB] 90 tablet 1    Sig: TAKE 1 TABLET BY MOUTH EVERY DAY     Cardiovascular:  ACEI + Diuretic Combos Failed - 12/06/2023 12:23 PM      Failed - Na in normal range and within 180 days    Sodium  Date Value Ref Range Status  04/17/2023 141 134 - 144 mmol/L Final         Failed - K in normal range and within 180 days    Potassium  Date Value Ref Range Status  04/17/2023 4.6 3.5 - 5.2 mmol/L Final         Failed - Cr in normal range and within 180 days    Creatinine, Ser  Date Value Ref Range Status  04/17/2023 1.20 0.76 - 1.27 mg/dL Final         Failed - eGFR is 30 or above and within 180 days    GFR calc Af Amer  Date Value Ref Range Status  09/30/2020 94 >59 mL/min/1.73 Final    Comment:    **In accordance with recommendations from the NKF-ASN Task force,**   [facility] is in the process of updating its eGFR calculation to the   2021 CKD-EPI creatinine equation that estimates kidney function   without a race variable.    GFR calc non Af Amer  Date Value Ref Range Status  09/30/2020 82 >59 mL/min/1.73 Final   eGFR  Date Value Ref Range Status  04/17/2023 71 >59 mL/min/1.73 Final         Passed - Patient is not pregnant      Passed - Last BP in normal range    BP Readings from Last 1 Encounters:  07/19/23 122/78         Passed - Valid encounter within last 6 months    Recent Outpatient Visits           4 months ago Controlled type 2 diabetes mellitus without complication, without long-term current use of insulin (HCC)    [place] [facility], MD   8 months ago Essential hypertension   [place] [facility] at [facility], MD   1 year ago Controlled type 2 diabetes mellitus without complication, without long-term current use of insulin (HCC)   [place] [facility] at [facility], MD   1 year ago Controlled type 2 diabetes mellitus without complication, without long-term current use of insulin (HCC)   [place] [facility] at [facility], MD   1 year ago Controlled type 2 diabetes mellitus without complication, without long-term current use of insulin (HCC)   [place] [facility] at [facility], MD              Signed Prescriptions Disp Refills   atorvastatin (LIPITOR) 10 MG tablet 90 tablet 1    Sig: TAKE 1 TABLET BY MOUTH EVERY DAY  Cardiovascular:  Antilipid - Statins Failed - 12/06/2023 12:23 PM      Failed - Lipid Panel in normal range within the last 12 months    Cholesterol, Total  Date Value Ref Range Status  04/17/2023 163 100 - 199 mg/dL Final   LDL Chol Calc (NIH)  Date Value Ref Range Status  04/17/2023 107 (H) 0 - 99 mg/dL Final   HDL  Date Value Ref Range Status  04/17/2023 40 >39 mg/dL Final   Triglycerides  Date Value Ref Range Status  04/17/2023 86 0 - 149 mg/dL Final         Passed - Patient is not pregnant      Passed - Valid encounter within last 12 months    Recent Outpatient Visits           4 months ago Controlled type 2 diabetes mellitus without complication, without long-term current use of insulin (HCC)   [place] [facility], MD   8 months ago Essential hypertension   [place] [facility] at [facility], MD   1 year ago  Controlled type 2 diabetes mellitus without complication, without long-term current use of insulin (HCC)   [place] [facility] at [facility], MD   1 year ago Controlled type 2 diabetes mellitus without complication, without long-term current use of insulin (HCC)   [place] [facility] at [facility], MD   1 year ago Controlled type 2 diabetes mellitus without complication, without long-term current use of insulin [facility])    [facility], MD

## 2023-12-20 ENCOUNTER — Ambulatory Visit (INDEPENDENT_AMBULATORY_CARE_PROVIDER_SITE_OTHER): Payer: 59 | Admitting: Family Medicine

## 2023-12-20 ENCOUNTER — Encounter: Payer: Self-pay | Admitting: Family Medicine

## 2023-12-20 VITALS — BP 104/64 | HR 107 | Ht 73.0 in | Wt 273.0 lb

## 2023-12-20 DIAGNOSIS — Z7984 Long term (current) use of oral hypoglycemic drugs: Secondary | ICD-10-CM

## 2023-12-20 DIAGNOSIS — E782 Mixed hyperlipidemia: Secondary | ICD-10-CM | POA: Diagnosis not present

## 2023-12-20 DIAGNOSIS — I10 Essential (primary) hypertension: Secondary | ICD-10-CM | POA: Diagnosis not present

## 2023-12-20 DIAGNOSIS — E119 Type 2 diabetes mellitus without complications: Secondary | ICD-10-CM

## 2023-12-20 MED ORDER — TADALAFIL 5 MG PO TABS: 5.0000 mg | ORAL_TABLET | Freq: Every day | ORAL | 11 refills | Status: DC | PRN

## 2023-12-20 MED ORDER — ATORVASTATIN CALCIUM 10 MG PO TABS: 10.0000 mg | ORAL_TABLET | Freq: Every day | ORAL | 1 refills | Status: DC

## 2023-12-20 MED ORDER — HYDROCHLOROTHIAZIDE 12.5 MG PO CAPS: ORAL_CAPSULE | ORAL | 0 refills | Status: DC

## 2023-12-20 MED ORDER — LISINOPRIL-HYDROCHLOROTHIAZIDE 10-12.5 MG PO TABS: 1.0000 | ORAL_TABLET | Freq: Every day | ORAL | 0 refills | Status: DC

## 2023-12-20 MED ORDER — METFORMIN HCL 500 MG PO TABS: ORAL_TABLET | ORAL | 1 refills | Status: DC

## 2023-12-20 NOTE — Progress Notes (Signed)
 Date:  12/20/2023   Name:  Adam Olson   DOB:  21-Nov-1966   MRN:  [id number]   Chief Complaint: Diabetes, Hyperlipidemia, and Hypertension  Diabetes He presents for his follow-up diabetic visit. He has type 2 diabetes mellitus. His disease course has been stable. There are no hypoglycemic associated symptoms. Pertinent negatives for hypoglycemia include no dizziness, headaches or nervousness/anxiousness. There are no diabetic associated symptoms. Pertinent negatives for diabetes include no chest pain and no polydipsia. Symptoms are stable. There are no diabetic complications. Pertinent negatives for diabetic complications include no CVA, PVD or retinopathy. There are no known risk factors for coronary artery disease. When asked about current treatments, none were reported. He is compliant with treatment all of the time. He is following a generally healthy diet. He participates in exercise intermittently. An ACE inhibitor/angiotensin II receptor blocker is being taken.  Hyperlipidemia This is a chronic problem. The current episode started more than 1 year ago. Recent lipid tests were reviewed and are high. He has no history of chronic renal disease, diabetes, hypothyroidism, liver disease, obesity or nephrotic syndrome. Pertinent negatives include no chest pain, myalgias or shortness of breath. Current antihyperlipidemic treatment includes statins. The current treatment provides moderate improvement of lipids. There are no compliance problems.   Hypertension The current episode started more than 1 year ago. The problem has been rapidly improving since onset. The problem is controlled. Pertinent negatives include no anxiety, chest pain, headaches, neck pain, palpitations or shortness of breath. Past treatments include ACE inhibitors and diuretics. The current treatment provides moderate improvement. There is no history of angina, kidney disease, CAD/MI, CVA, heart failure, left ventricular  hypertrophy, PVD or retinopathy. There is no history of chronic renal disease, a hypertension causing med or renovascular disease.    Lab Results  Component Value Date   NA 141 04/17/2023   K 4.6 04/17/2023   CO2 24 04/17/2023   GLUCOSE 125 (H) 04/17/2023   BUN 12 04/17/2023   CREATININE 1.20 04/17/2023   CALCIUM  9.8 04/17/2023   EGFR 71 04/17/2023   GFRNONAA 82 09/30/2020   Lab Results  Component Value Date   CHOL 163 04/17/2023   HDL 40 04/17/2023   LDLCALC 107 (H) 04/17/2023   TRIG 86 04/17/2023   CHOLHDL 6.3 (H) 05/22/2016   No results found for: TSH Lab Results  Component Value Date   HGBA1C 5.7 (H) 07/19/2023   Lab Results  Component Value Date   WBC 7.8 09/30/2020   HGB 13.6 09/30/2020   HCT 41.1 09/30/2020   MCV 82 09/30/2020   PLT 180 09/30/2020   Lab Results  Component Value Date   ALT 40 09/14/2022   AST 48 (H) 09/14/2022   ALKPHOS 119 09/14/2022   BILITOT 0.9 09/14/2022   No results found for: 25OHVITD2, 25OHVITD3, VD25OH   Review of Systems  Constitutional:  Negative for chills and fever.  HENT:  Negative for drooling, ear discharge, ear pain and sore throat.   Respiratory:  Negative for cough, shortness of breath and wheezing.   Cardiovascular:  Negative for chest pain, palpitations and leg swelling.  Gastrointestinal:  Negative for abdominal pain, blood in stool, constipation, diarrhea and nausea.  Endocrine: Negative for polydipsia.  Genitourinary:  Negative for dysuria, frequency, hematuria and urgency.  Musculoskeletal:  Negative for back pain, myalgias and neck pain.  Skin:  Negative for rash.  Allergic/Immunologic: Negative for environmental allergies.  Neurological:  Negative for dizziness and headaches.  Hematological:  Does  not bruise/bleed easily.  Psychiatric/Behavioral:  Negative for suicidal ideas. The patient is not nervous/anxious.     Patient Active Problem List   Diagnosis Date Noted   Mixed hyperlipidemia  09/11/2021   Essential hypertension 09/11/2021   Routine general medical examination at a health care facility 05/02/2015   Dermatitis, eczematoid 05/02/2015   Blood pressure elevated 05/02/2015   Adiposity 05/02/2015    No Known Allergies  Past Surgical History:  Procedure Laterality Date   LIPOMA EXCISION     scapula   RECONSTRUCTION OF EYELID      Social History   Tobacco Use   Smoking status: Every Day    Types: Cigars   Smokeless tobacco: Never  Substance Use Topics   Alcohol use: Yes    Alcohol/week: 0.0 standard drinks of alcohol   Drug use: No     Medication list has been reviewed and updated.  Current Meds  Medication Sig   atorvastatin  (LIPITOR) 10 MG tablet TAKE 1 TABLET BY MOUTH EVERY DAY   clotrimazole -betamethasone  (LOTRISONE ) cream APPLY TOPICALLY TO AFFECTED AREA EVERY DAY   hydrochlorothiazide  (MICROZIDE ) 12.5 MG capsule TAKE 1 CAPSULE BY MOUTH EVERY DAY   lisinopril -hydrochlorothiazide  (ZESTORETIC ) 10-12.5 MG tablet TAKE 1 TABLET BY MOUTH EVERY DAY   metFORMIN  (GLUCOPHAGE ) 500 MG tablet TAKE 1 TABLET BY MOUTH twice a day   mupirocin  ointment (BACTROBAN ) 2 % Apply 1 application topically 2 (two) times daily.   tadalafil  (CIALIS ) 5 MG tablet Take 1 tablet (5 mg total) by mouth daily as needed for erectile dysfunction.       12/20/2023    2:16 PM 07/19/2023    7:59 AM 03/15/2023    8:05 AM 09/14/2022    8:10 AM  GAD 7 : Generalized Anxiety Score  Nervous, Anxious, on Edge 0 0 0 0  Control/stop worrying 0 0 0 0  Worry too much - different things 0 0 0 0  Trouble relaxing 0 0 0 0  Restless 0 0 0 0  Easily annoyed or irritable 0 0 0 0  Afraid - awful might happen 0 0 0 0  Total GAD 7 Score 0 0 0 0  Anxiety Difficulty Not difficult at all Not difficult at all Not difficult at all Not difficult at all       12/20/2023    2:15 PM 07/19/2023    7:59 AM 03/15/2023    8:05 AM  Depression screen PHQ 2/9  Decreased Interest 0 0 0  Down, Depressed, Hopeless  0 0 0  PHQ - 2 Score 0 0 0  Altered sleeping 0 0 0  Tired, decreased energy 0 0 0  Change in appetite 0 0 0  Feeling bad or failure about yourself  0 0 0  Trouble concentrating 0 0 0  Moving slowly or fidgety/restless 0 0 0  Suicidal thoughts 0 0 0  PHQ-9 Score 0 0 0  Difficult doing work/chores Not difficult at all Not difficult at all Not difficult at all    BP Readings from Last 3 Encounters:  12/20/23 104/64  07/19/23 122/78  03/15/23 110/64    Physical Exam Vitals and nursing note reviewed.  HENT:     Head: Normocephalic.     Right Ear: Tympanic membrane and external ear normal.     Left Ear: Tympanic membrane and external ear normal.     Nose: Nose normal. No congestion or rhinorrhea.     Mouth/Throat:     Pharynx: No oropharyngeal exudate or posterior  oropharyngeal erythema.  Eyes:     General: No scleral icterus.       Right eye: No discharge.        Left eye: No discharge.     Conjunctiva/sclera: Conjunctivae normal.     Pupils: Pupils are equal, round, and reactive to light.  Neck:     Thyroid: No thyromegaly.     Vascular: No JVD.     Trachea: No tracheal deviation.  Cardiovascular:     Rate and Rhythm: Normal rate and regular rhythm.     Heart sounds: Normal heart sounds. No murmur heard.    No friction rub. No gallop.  Pulmonary:     Effort: No respiratory distress.     Breath sounds: Normal breath sounds. No wheezing or rales.  Abdominal:     General: Bowel sounds are normal.     Palpations: Abdomen is soft. There is no mass.     Tenderness: There is no abdominal tenderness. There is no guarding or rebound.  Musculoskeletal:        General: No tenderness. Normal range of motion.     Cervical back: Normal range of motion and neck supple.  Lymphadenopathy:     Cervical: No cervical adenopathy.  Skin:    General: Skin is warm.     Findings: No rash.  Neurological:     Mental Status: He is alert and oriented to person, place, and time.     Cranial  Nerves: No cranial nerve deficit.     Deep Tendon Reflexes: Reflexes are normal and symmetric.     Wt Readings from Last 3 Encounters:  12/20/23 273 lb (123.8 kg)  07/19/23 264 lb (119.7 kg)  03/15/23 266 lb (120.7 kg)    BP 104/64 (BP Location: Left Arm, Patient Position: Sitting, Cuff Size: Large)   Pulse (!) 107   Ht 6' 1 (1.854 m)   Wt 273 lb (123.8 kg)   SpO2 95%   BMI 36.02 kg/m   Assessment and Plan: 1. Mixed hyperlipidemia Chronic.  Controlled.  Stable.  Continue atorvastatin  10 mg once a day.  Will check lipid panel for current level of LDL control - atorvastatin  (LIPITOR) 10 MG tablet; Take 1 tablet (10 mg total) by mouth daily.  Dispense: 90 tablet; Refill: 1 - Lipid Panel With LDL/HDL Ratio  2. Essential hypertension Chronic.  Controlled.  Stable.  Blood pressure 104/64.  Continue hydrochlorothiazide  12.5 mg and lisinopril  hydrochlorothiazide  10-12.5 mg which would be a total of 25 mg of diuretic hydrochlorothiazide  with 10 mg of Zestoretic . - hydrochlorothiazide  (MICROZIDE ) 12.5 MG capsule; TAKE 1 CAPSULE BY MOUTH EVERY DAY  Dispense: 90 capsule; Refill: 0 - lisinopril -hydrochlorothiazide  (ZESTORETIC ) 10-12.5 MG tablet; Take 1 tablet by mouth daily.  Dispense: 90 tablet; Refill: 0 - Renal Function Panel  3. Controlled type 2 diabetes mellitus without complication, without long-term current use of insulin (HCC) (Primary) Chronic.  Controlled.  Stable.  Continue metformin  500 mg twice a day.  And we will examine A1c for current level of control.  I will recheck patient in 4 months. - metFORMIN  (GLUCOPHAGE ) 500 MG tablet; TAKE 1 TABLET BY MOUTH twice a day  Dispense: 180 tablet; Refill: 1 - Hemoglobin A1c     Cathryne Molt, MD

## 2024-01-15 ENCOUNTER — Encounter: Payer: Self-pay | Admitting: Family Medicine

## 2024-01-16 ENCOUNTER — Encounter: Payer: Self-pay | Admitting: Family Medicine

## 2024-01-16 LAB — LIPID PANEL WITH LDL/HDL RATIO
Cholesterol, Total: 193 mg/dL (ref 100–199)
HDL: 35 mg/dL — ABNORMAL LOW (ref >39)
LDL Chol Calc (NIH): 126 mg/dL — ABNORMAL HIGH (ref 0–99)
LDL/HDL Ratio: 3.6 {ratio} (ref 0.0–3.6)
Triglycerides: 181 mg/dL — ABNORMAL HIGH (ref 0–149)
VLDL Cholesterol Cal: 32 mg/dL (ref 5–40)

## 2024-01-16 LAB — RENAL FUNCTION PANEL
Albumin: 5 g/dL — ABNORMAL HIGH (ref 3.8–4.9)
BUN/Creatinine Ratio: 19 (ref 9–20)
BUN: 27 mg/dL — ABNORMAL HIGH (ref 6–24)
CO2: 20 mmol/L (ref 20–29)
Calcium: 9.7 mg/dL (ref 8.7–10.2)
Chloride: 87 mmol/L — ABNORMAL LOW (ref 96–106)
Creatinine, Ser: 1.43 mg/dL — ABNORMAL HIGH (ref 0.76–1.27)
Glucose: 104 mg/dL — ABNORMAL HIGH (ref 70–99)
Phosphorus: 4.1 mg/dL (ref 2.8–4.1)
Potassium: 4 mmol/L (ref 3.5–5.2)
Sodium: 136 mmol/L (ref 134–144)
eGFR: 58 mL/min/{1.73_m2} — ABNORMAL LOW (ref >59)

## 2024-01-16 LAB — HEMOGLOBIN A1C
Est. average glucose Bld gHb Est-mCnc: 111 mg/dL
Hgb A1c MFr Bld: 5.5 % (ref 4.8–5.6)

## 2024-02-07 ENCOUNTER — Ambulatory Visit
Admission: RE | Admit: 2024-02-07 | Discharge: 2024-02-07 | Disposition: A | Discharge status: Home / Self Care | Payer: 59 | Source: Ambulatory Visit | Attending: Family Medicine | Admitting: Family Medicine

## 2024-02-07 ENCOUNTER — Ambulatory Visit: Payer: 59 | Admitting: Family Medicine

## 2024-02-07 ENCOUNTER — Encounter: Payer: Self-pay | Admitting: Family Medicine

## 2024-02-07 ENCOUNTER — Ambulatory Visit: Payer: 59

## 2024-02-07 ENCOUNTER — Other Ambulatory Visit
Admission: RE | Admit: 2024-02-07 | Discharge: 2024-02-07 | Disposition: A | Discharge status: Home / Self Care | Payer: 59 | Source: Home / Self Care | Attending: Family Medicine | Admitting: Family Medicine

## 2024-02-07 ENCOUNTER — Ambulatory Visit
Admission: RE | Admit: 2024-02-07 | Discharge: 2024-02-07 | Disposition: A | Discharge status: Home / Self Care | Payer: 59 | Attending: Family Medicine | Admitting: Family Medicine

## 2024-02-07 VITALS — BP 110/76 | HR 114 | Ht 73.0 in | Wt 256.0 lb

## 2024-02-07 DIAGNOSIS — J189 Pneumonia, unspecified organism: Secondary | ICD-10-CM | POA: Insufficient documentation

## 2024-02-07 LAB — CBC WITH DIFFERENTIAL/PLATELET
Abs Immature Granulocytes: 0.04 10*3/uL (ref 0.00–0.07)
Basophils Absolute: 0 10*3/uL (ref 0.0–0.1)
Basophils Relative: 0 %
Eosinophils Absolute: 0.2 10*3/uL (ref 0.0–0.5)
Eosinophils Relative: 2 %
HCT: 36.8 % — ABNORMAL LOW (ref 39.0–52.0)
Hemoglobin: 12.3 g/dL — ABNORMAL LOW (ref 13.0–17.0)
Immature Granulocytes: 0 %
Lymphocytes Relative: 21 %
Lymphs Abs: 2 10*3/uL (ref 0.7–4.0)
MCH: 27.2 pg (ref 26.0–34.0)
MCHC: 33.4 g/dL (ref 30.0–36.0)
MCV: 81.4 fL (ref 80.0–100.0)
Monocytes Absolute: 0.9 10*3/uL (ref 0.1–1.0)
Monocytes Relative: 10 %
Neutro Abs: 6.3 10*3/uL (ref 1.7–7.7)
Neutrophils Relative %: 67 %
Platelets: 174 10*3/uL (ref 150–400)
RBC: 4.52 MIL/uL (ref 4.22–5.81)
RDW: 14.4 % (ref 11.5–15.5)
WBC: 9.4 10*3/uL (ref 4.0–10.5)
nRBC: 0 % (ref 0.0–0.2)

## 2024-02-07 MED ORDER — AZITHROMYCIN 250 MG PO TABS: ORAL_TABLET | ORAL | 0 refills | Status: AC

## 2024-02-07 MED ORDER — PROMETHAZINE-DM 6.25-15 MG/5ML PO SYRP: 5.0000 mL | ORAL_SOLUTION | Freq: Four times a day (QID) | ORAL | 0 refills | Status: DC | PRN

## 2024-02-07 NOTE — Progress Notes (Signed)
Date:  02/07/2024   Name:  Adam Olson   DOB:  1966-07-13   MRN:  [id number]   Chief Complaint: Cough (X1 week, congestion, body chills, no fever )  Cough This is a new problem. The current episode started in the past 7 days. The problem has been waxing and waning. The problem occurs every few minutes. The cough is Non-productive. Associated symptoms include postnasal drip and rhinorrhea. Pertinent negatives include no chest pain, chills, ear pain, fever, headaches, heartburn, hemoptysis, myalgias, nasal congestion, rash, sore throat, shortness of breath, sweats, weight loss or wheezing. The symptoms are aggravated by lying down. Risk factors for lung disease include smoking/tobacco exposure. He has tried OTC cough suppressant for the symptoms. The treatment provided no relief.    Lab Results  Component Value Date   NA 136 01/15/2024   K 4.0 01/15/2024   CO2 20 01/15/2024   GLUCOSE 104 (H) 01/15/2024   BUN 27 (H) 01/15/2024   CREATININE 1.43 (H) 01/15/2024   CALCIUM 9.7 01/15/2024   EGFR 58 (L) 01/15/2024   GFRNONAA 82 09/30/2020   Lab Results  Component Value Date   CHOL 193 01/15/2024   HDL 35 (L) 01/15/2024   LDLCALC 126 (H) 01/15/2024   TRIG 181 (H) 01/15/2024   CHOLHDL 6.3 (H) 05/22/2016   No results found for: "TSH" Lab Results  Component Value Date   HGBA1C 5.5 01/15/2024   Lab Results  Component Value Date   WBC 7.8 09/30/2020   HGB 13.6 09/30/2020   HCT 41.1 09/30/2020   MCV 82 09/30/2020   PLT 180 09/30/2020   Lab Results  Component Value Date   ALT 40 09/14/2022   AST 48 (H) 09/14/2022   ALKPHOS 119 09/14/2022   BILITOT 0.9 09/14/2022   No results found for: "25OHVITD2", "25OHVITD3", "VD25OH"   Review of Systems  Constitutional:  Negative for chills, fever and weight loss.  HENT:  Positive for postnasal drip and rhinorrhea. Negative for congestion, ear pain, nosebleeds, sinus pressure, sneezing and sore throat.   Eyes:  Negative for  photophobia and visual disturbance.  Respiratory:  Positive for cough. Negative for hemoptysis, choking, chest tightness, shortness of breath and wheezing.   Cardiovascular:  Negative for chest pain, palpitations and leg swelling.  Gastrointestinal:  Negative for heartburn.  Musculoskeletal:  Negative for myalgias.  Skin:  Negative for rash.  Neurological:  Negative for headaches.    Patient Active Problem List   Diagnosis Date Noted   Mixed hyperlipidemia 09/11/2021   Essential hypertension 09/11/2021   Routine general medical examination at a health care facility 05/02/2015   Dermatitis, eczematoid 05/02/2015   Blood pressure elevated 05/02/2015   Adiposity 05/02/2015    No Known Allergies  Past Surgical History:  Procedure Laterality Date   LIPOMA EXCISION     scapula   RECONSTRUCTION OF EYELID      Social History   Tobacco Use   Smoking status: Every Day    Types: Cigars   Smokeless tobacco: Never  Substance Use Topics   Alcohol use: Yes    Alcohol/week: 0.0 standard drinks of alcohol   Drug use: No     Medication list has been reviewed and updated.  Current Meds  Medication Sig   atorvastatin (LIPITOR) 10 MG tablet Take 1 tablet (10 mg total) by mouth daily.   clotrimazole-betamethasone (LOTRISONE) cream APPLY TOPICALLY TO AFFECTED AREA EVERY DAY   docusate sodium (COLACE) 100 MG capsule Take 1 capsule twice a  day by oral route.   lisinopril-hydrochlorothiazide (ZESTORETIC) 10-12.5 MG tablet Take 1 tablet by mouth daily.   metFORMIN (GLUCOPHAGE) 500 MG tablet TAKE 1 TABLET BY MOUTH twice a day   mupirocin ointment (BACTROBAN) 2 % Apply 1 application topically 2 (two) times daily.   omeprazole (PRILOSEC) 40 MG capsule Take 40 mg by mouth 2 (two) times daily.   tadalafil (CIALIS) 5 MG tablet Take 1 tablet (5 mg total) by mouth daily as needed for erectile dysfunction.   [DISCONTINUED] hydrochlorothiazide (MICROZIDE) 12.5 MG capsule TAKE 1 CAPSULE BY MOUTH EVERY  DAY       02/07/2024    9:44 AM 12/20/2023    2:16 PM 07/19/2023    7:59 AM 03/15/2023    8:05 AM  GAD 7 : Generalized Anxiety Score  Nervous, Anxious, on Edge 0 0 0 0  Control/stop worrying 0 0 0 0  Worry too much - different things 0 0 0 0  Trouble relaxing 0 0 0 0  Restless 0 0 0 0  Easily annoyed or irritable 0 0 0 0  Afraid - awful might happen 0 0 0 0  Total GAD 7 Score 0 0 0 0  Anxiety Difficulty Not difficult at all Not difficult at all Not difficult at all Not difficult at all       02/07/2024    9:44 AM 12/20/2023    2:15 PM 07/19/2023    7:59 AM  Depression screen PHQ 2/9  Decreased Interest 0 0 0  Down, Depressed, Hopeless 0 0 0  PHQ - 2 Score 0 0 0  Altered sleeping  0 0  Tired, decreased energy  0 0  Change in appetite  0 0  Feeling bad or failure about yourself   0 0  Trouble concentrating  0 0  Moving slowly or fidgety/restless  0 0  Suicidal thoughts  0 0  PHQ-9 Score  0 0  Difficult doing work/chores  Not difficult at all Not difficult at all    BP Readings from Last 3 Encounters:  02/07/24 110/76  12/20/23 104/64  07/19/23 122/78    Physical Exam Vitals and nursing note reviewed.  Constitutional:      Appearance: He is well-developed.  HENT:     Head: Normocephalic and atraumatic.     Right Ear: Tympanic membrane, ear canal and external ear normal.     Left Ear: Tympanic membrane, ear canal and external ear normal.     Nose: Nose normal.     Mouth/Throat:     Mouth: Mucous membranes are moist.     Dentition: Normal dentition.     Pharynx: No posterior oropharyngeal erythema.  Eyes:     General: Lids are normal. No scleral icterus.    Conjunctiva/sclera: Conjunctivae normal.     Pupils: Pupils are equal, round, and reactive to light.  Neck:     Thyroid: No thyromegaly.     Vascular: No carotid bruit, hepatojugular reflux or JVD.     Trachea: No tracheal deviation.  Cardiovascular:     Rate and Rhythm: Normal rate and regular rhythm.      Heart sounds: Normal heart sounds. No murmur heard.    No gallop.  Pulmonary:     Effort: Pulmonary effort is normal.     Breath sounds: Examination of the left-lower field reveals decreased breath sounds and rales. Decreased breath sounds and rales present. No wheezing or rhonchi.     Comments: Rales noted left posterior/dullness to percussion  noted at as well Abdominal:     General: Bowel sounds are normal.     Palpations: Abdomen is soft. There is no hepatomegaly, splenomegaly or mass.     Tenderness: There is no abdominal tenderness.     Hernia: There is no hernia in the left inguinal area.  Musculoskeletal:        General: Normal range of motion.     Cervical back: Normal range of motion and neck supple.  Lymphadenopathy:     Cervical: No cervical adenopathy.  Skin:    General: Skin is warm and dry.     Findings: No rash.  Neurological:     Mental Status: He is alert and oriented to person, place, and time.     Sensory: No sensory deficit.     Deep Tendon Reflexes: Reflexes are normal and symmetric.  Psychiatric:        Mood and Affect: Mood is not anxious or depressed.     Wt Readings from Last 3 Encounters:  02/07/24 256 lb (116.1 kg)  12/20/23 273 lb (123.8 kg)  07/19/23 264 lb (119.7 kg)    BP 110/76   Pulse (!) 114   Ht 6\' 1"  (1.854 m)   Wt 256 lb (116.1 kg)   SpO2 95%   BMI 33.78 kg/m   Assessment and Plan: 1. Pneumonia of left lower lobe due to infectious organism (Primary) New onset.  Persistent.  Relatively stable except there is some mild tach arrhythmia.  Heart does sound normal without murmurs gallops or rub and pulse rate came down to 88 on auscultation.  Will obtain a chest x-ray and that there is decreased breath sounds and fine rales in left posterior lobe.  Given the duration we will check CBC and chest x-ray and initiate azithromycin to 50 mg 2 today followed by 4 tablets over the next 4 days.  Given the nonproductive nature of the cough I am  concerned about mycoplasma.  Patient will be also prescribed Promethazine DM for cough but has been encouraged to use Mucinex DM during the day for his mucolytic action.  Patient's been encouraged to go to ER or return if no significant improvement over the next 48 hours. - DG Chest 2 View - CBC with Differential/Platelet - azithromycin (ZITHROMAX) 250 MG tablet; Take 2 tablets on day 1, then 1 tablet daily on days 2 through 5  Dispense: 6 tablet; Refill: 0 - promethazine-dextromethorphan (PROMETHAZINE-DM) 6.25-15 MG/5ML syrup; Take 5 mLs by mouth 4 (four) times daily as needed for cough.  Dispense: 118 mL; Refill: 0     Elizabeth Sauer, MD

## 2024-02-08 ENCOUNTER — Encounter: Payer: Self-pay | Admitting: Family Medicine

## 2024-04-20 ENCOUNTER — Ambulatory Visit (INDEPENDENT_AMBULATORY_CARE_PROVIDER_SITE_OTHER): Payer: Self-pay | Admitting: Family Medicine

## 2024-04-20 ENCOUNTER — Encounter: Payer: Self-pay | Admitting: Family Medicine

## 2024-04-20 VITALS — BP 152/90 | HR 95 | Ht 73.0 in | Wt 278.4 lb

## 2024-04-20 DIAGNOSIS — E782 Mixed hyperlipidemia: Secondary | ICD-10-CM

## 2024-04-20 DIAGNOSIS — I10 Essential (primary) hypertension: Secondary | ICD-10-CM | POA: Diagnosis not present

## 2024-04-20 DIAGNOSIS — Z7984 Long term (current) use of oral hypoglycemic drugs: Secondary | ICD-10-CM

## 2024-04-20 DIAGNOSIS — E119 Type 2 diabetes mellitus without complications: Secondary | ICD-10-CM

## 2024-04-20 DIAGNOSIS — N529 Male erectile dysfunction, unspecified: Secondary | ICD-10-CM | POA: Diagnosis not present

## 2024-04-20 MED ORDER — TADALAFIL 5 MG PO TABS: 5.0000 mg | ORAL_TABLET | Freq: Every day | ORAL | 11 refills | Status: AC | PRN

## 2024-04-20 MED ORDER — METFORMIN HCL 500 MG PO TABS: ORAL_TABLET | ORAL | 1 refills | Status: AC

## 2024-04-20 MED ORDER — LISINOPRIL-HYDROCHLOROTHIAZIDE 10-12.5 MG PO TABS: 1.0000 | ORAL_TABLET | Freq: Every day | ORAL | 0 refills | Status: DC

## 2024-04-20 MED ORDER — ATORVASTATIN CALCIUM 10 MG PO TABS: 10.0000 mg | ORAL_TABLET | Freq: Every day | ORAL | 1 refills | Status: AC

## 2024-04-20 NOTE — Progress Notes (Signed)
 Date:  04/20/2024   Name:  Adam Olson   DOB:  July 06, 1966   MRN:  [id number]   Chief Complaint: Diabetes  Diabetes He presents for his follow-up diabetic visit. He has type 2 diabetes mellitus. His disease course has been stable. There are no hypoglycemic associated symptoms. Pertinent negatives for hypoglycemia include no headaches or sweats. There are no diabetic associated symptoms. Pertinent negatives for diabetes include no blurred vision, no chest pain and no fatigue. There are no hypoglycemic complications. Pertinent negatives for hypoglycemia complications include no blackouts, no hospitalization, no nocturnal hypoglycemia, no required assistance and no required glucagon injection. Symptoms are stable. There are no diabetic complications. Pertinent negatives for diabetic complications include no CVA. There are no known risk factors for coronary artery disease. Current diabetic treatment includes oral agent (monotherapy). He is following a generally healthy diet. Meal planning includes avoidance of concentrated sweets and carbohydrate counting. He participates in exercise intermittently. An ACE inhibitor/angiotensin II receptor blocker is being taken.  Hyperlipidemia This is a chronic problem. The current episode started more than 1 year ago. The problem is controlled. Recent lipid tests were reviewed and are normal. He has no history of chronic renal disease, diabetes, hypothyroidism, liver disease, obesity or nephrotic syndrome. There are no known factors aggravating his hyperlipidemia. Pertinent negatives include no chest pain or shortness of breath. Current antihyperlipidemic treatment includes statins. The current treatment provides moderate improvement of lipids. There are no compliance problems.   Hypertension This is a chronic problem. The current episode started more than 1 year ago. The problem has been gradually improving since onset. The problem is controlled. Pertinent  negatives include no anxiety, blurred vision, chest pain, headaches, malaise/fatigue, neck pain, orthopnea, palpitations, peripheral edema, PND, shortness of breath or sweats. There are no associated agents to hypertension. There are no known risk factors for coronary artery disease. Past treatments include ACE inhibitors and diuretics. The current treatment provides moderate improvement. There are no compliance problems.  There is no history of CAD/MI or CVA. There is no history of chronic renal disease, a hypertension causing med or renovascular disease.  Erectile Dysfunction This is a chronic problem. The problem has been gradually improving since onset. The nature of his difficulty is achieving erection. Pertinent negatives include no chills or hematuria. Past treatments include tadalafil . The treatment provided moderate relief.    Lab Results  Component Value Date   NA 136 01/15/2024   K 4.0 01/15/2024   CO2 20 01/15/2024   GLUCOSE 104 (H) 01/15/2024   BUN 27 (H) 01/15/2024   CREATININE 1.43 (H) 01/15/2024   CALCIUM  9.7 01/15/2024   EGFR 58 (L) 01/15/2024   GFRNONAA 82 09/30/2020   Lab Results  Component Value Date   CHOL 193 01/15/2024   HDL 35 (L) 01/15/2024   LDLCALC 126 (H) 01/15/2024   TRIG 181 (H) 01/15/2024   CHOLHDL 6.3 (H) 05/22/2016   No results found for: "TSH" Lab Results  Component Value Date   HGBA1C 5.5 01/15/2024   Lab Results  Component Value Date   WBC 9.4 02/07/2024   HGB 12.3 (L) 02/07/2024   HCT 36.8 (L) 02/07/2024   MCV 81.4 02/07/2024   PLT 174 02/07/2024   Lab Results  Component Value Date   ALT 40 09/14/2022   AST 48 (H) 09/14/2022   ALKPHOS 119 09/14/2022   BILITOT 0.9 09/14/2022   No results found for: "25OHVITD2", "25OHVITD3", "VD25OH"   Review of Systems  Constitutional:  Positive  for unexpected weight change. Negative for chills, diaphoresis, fatigue and malaise/fatigue.  Eyes:  Negative for blurred vision and visual disturbance.   Respiratory:  Negative for chest tightness, shortness of breath and wheezing.   Cardiovascular:  Negative for chest pain, palpitations, orthopnea and PND.  Gastrointestinal:  Negative for blood in stool.  Genitourinary:  Negative for hematuria.  Musculoskeletal:  Negative for neck pain.  Neurological:  Negative for headaches.    Patient Active Problem List   Diagnosis Date Noted   Mixed hyperlipidemia 09/11/2021   Essential hypertension 09/11/2021   Routine general medical examination at a health care facility 05/02/2015   Dermatitis, eczematoid 05/02/2015   Blood pressure elevated 05/02/2015   Adiposity 05/02/2015    No Known Allergies  Past Surgical History:  Procedure Laterality Date   LIPOMA EXCISION     scapula   RECONSTRUCTION OF EYELID      Social History   Tobacco Use   Smoking status: Every Day    Types: Cigars   Smokeless tobacco: Never  Substance Use Topics   Alcohol use: Yes    Alcohol/week: 0.0 standard drinks of alcohol   Drug use: No     Medication list has been reviewed and updated.  Current Meds  Medication Sig   atorvastatin  (LIPITOR) 10 MG tablet Take 1 tablet (10 mg total) by mouth daily.   docusate sodium (COLACE) 100 MG capsule Take 1 capsule twice a day by oral route.   lisinopril -hydrochlorothiazide  (ZESTORETIC ) 10-12.5 MG tablet Take 1 tablet by mouth daily.   metFORMIN  (GLUCOPHAGE ) 500 MG tablet TAKE 1 TABLET BY MOUTH twice a day   omeprazole (PRILOSEC) 40 MG capsule Take 40 mg by mouth 2 (two) times daily.   tadalafil  (CIALIS ) 5 MG tablet Take 1 tablet (5 mg total) by mouth daily as needed for erectile dysfunction.       04/20/2024    8:07 AM 02/07/2024    9:44 AM 12/20/2023    2:16 PM 07/19/2023    7:59 AM  GAD 7 : Generalized Anxiety Score  Nervous, Anxious, on Edge 0 0 0 0  Control/stop worrying 0 0 0 0  Worry too much - different things 0 0 0 0  Trouble relaxing 0 0 0 0  Restless 0 0 0 0  Easily annoyed or irritable 0 0 0 0   Afraid - awful might happen 0 0 0 0  Total GAD 7 Score 0 0 0 0  Anxiety Difficulty Not difficult at all Not difficult at all Not difficult at all Not difficult at all       04/20/2024    8:07 AM 02/07/2024    9:44 AM 12/20/2023    2:15 PM  Depression screen PHQ 2/9  Decreased Interest 0 0 0  Down, Depressed, Hopeless 0 0 0  PHQ - 2 Score 0 0 0  Altered sleeping 0  0  Tired, decreased energy 0  0  Change in appetite 0  0  Feeling bad or failure about yourself  0  0  Trouble concentrating 0  0  Moving slowly or fidgety/restless 0  0  Suicidal thoughts 0  0  PHQ-9 Score 0  0  Difficult doing work/chores Not difficult at all  Not difficult at all    BP Readings from Last 3 Encounters:  04/20/24 (!) 152/90  02/07/24 110/76  12/20/23 104/64    Physical Exam Vitals and nursing note reviewed.  Constitutional:      Appearance: He is well-developed.  HENT:     Head: Normocephalic and atraumatic.     Right Ear: Tympanic membrane, ear canal and external ear normal.     Left Ear: Tympanic membrane, ear canal and external ear normal.     Nose: Nose normal.     Mouth/Throat:     Dentition: Normal dentition.  Eyes:     General: Lids are normal. No scleral icterus.    Conjunctiva/sclera: Conjunctivae normal.     Pupils: Pupils are equal, round, and reactive to light.  Neck:     Thyroid: No thyromegaly.     Vascular: No carotid bruit, hepatojugular reflux or JVD.     Trachea: No tracheal deviation.  Cardiovascular:     Rate and Rhythm: Normal rate and regular rhythm.     Heart sounds: Normal heart sounds.  Pulmonary:     Effort: Pulmonary effort is normal.     Breath sounds: Normal breath sounds. No rhonchi or rales.  Abdominal:     General: Bowel sounds are normal.     Palpations: Abdomen is soft. There is no hepatomegaly, splenomegaly or mass.     Tenderness: There is no abdominal tenderness.     Hernia: There is no hernia in the left inguinal area.  Musculoskeletal:         General: Normal range of motion.     Cervical back: Normal range of motion and neck supple.  Lymphadenopathy:     Cervical: No cervical adenopathy.  Skin:    General: Skin is warm.     Findings: No rash.  Neurological:     Mental Status: He is alert.     Deep Tendon Reflexes: Reflexes are normal and symmetric.  Psychiatric:        Mood and Affect: Mood is not anxious or depressed.     Wt Readings from Last 3 Encounters:  04/20/24 278 lb 6.4 oz (126.3 kg)  02/07/24 256 lb (116.1 kg)  12/20/23 273 lb (123.8 kg)    BP (!) 152/90 (BP Location: Left Arm, Cuff Size: Large)   Pulse 95   Ht 6\' 1"  (1.854 m)   Wt 278 lb 6.4 oz (126.3 kg)   SpO2 97%   BMI 36.73 kg/m   Assessment and Plan:  1. Mixed hyperlipidemia Chronic.  Controlled.  Stable.  Asymptomatic.  Without myalgias without muscle weakness.  Continue atorvastatin  10 mg once a day.  Will recheck lipid panel upon return in 4 weeks past - atorvastatin  (LIPITOR) 10 MG tablet; Take 1 tablet (10 mg total) by mouth daily.  Dispense: 90 tablet; Refill: 1  2. Controlled type 2 diabetes mellitus without complication, without long-term current use of insulin (HCC) Chronic.  Controlled.  Stable.  Asymptomatic.  Without polyuria without polydipsia.  Will continue metformin  500 mg twice a day.  Will recheck in 4 weeks. - metFORMIN  (GLUCOPHAGE ) 500 MG tablet; TAKE 1 TABLET BY MOUTH twice a day  Dispense: 180 tablet; Refill: 1  3. Essential hypertension (Primary) Chronic.  Uncontrolled.  Stable.  Blood pressure 152/90.  This is the first time that has been elevated again we will continue with current dosings of medicationPatient has medication at home of lisinopril  hydrochlorothiazide  we will continue this current dosing which is the 10 that 12.5 mg combination.  Patient will continue at this current dose and will recheck in 4 weeks.  4. Vasculogenic erectile dysfunction, unspecified vasculogenic erectile dysfunction type   Chronic.   Controlled.  Stable.  Patient has results that - tadalafil  (CIALIS )  5 MG tablet; Take 1 tablet (5 mg total) by mouth daily as needed for erectile dysfunction.  Dispense: 30 tablet; Refill: 11    Alayne Allis, MD

## 2024-04-20 NOTE — Patient Instructions (Signed)
Managing Your Hypertension Hypertension, also called high blood pressure, is when the force of the blood pressing against the walls of the arteries is too strong. Arteries are blood vessels that carry blood from your heart throughout your body. Hypertension forces the heart to work harder to pump blood and may cause the arteries to become narrow or stiff. Understanding blood pressure readings A blood pressure reading includes a higher number over a lower number: The first, or top, number is called the systolic pressure. It is a measure of the pressure in your arteries as your heart beats. The second, or bottom number, is called the diastolic pressure. It is a measure of the pressure in your arteries as the heart relaxes. For most people, a normal blood pressure is below 120/80. Your personal target blood pressure may vary depending on your medical conditions, your age, and other factors. Blood pressure is classified into four stages. Based on your blood pressure reading, your health care provider may use the following stages to determine what type of treatment you need, if any. Systolic pressure and diastolic pressure are measured in a unit called millimeters of mercury (mmHg). Normal Systolic pressure: below 120. Diastolic pressure: below 80. Elevated Systolic pressure: 120-129. Diastolic pressure: below 80. Hypertension stage 1 Systolic pressure: 130-139. Diastolic pressure: 80-89. Hypertension stage 2 Systolic pressure: 140 or above. Diastolic pressure: 90 or above. How can this condition affect me? Managing your hypertension is very important. Over time, hypertension can damage the arteries and decrease blood flow to parts of the body, including the brain, heart, and kidneys. Having untreated or uncontrolled hypertension can lead to: A heart attack. A stroke. A weakened blood vessel (aneurysm). Heart failure. Kidney damage. Eye damage. Memory and concentration problems. Vascular  dementia. What actions can I take to manage this condition? Hypertension can be managed by making lifestyle changes and possibly by taking medicines. Your health care provider will help you make a plan to bring your blood pressure within a normal range. You may be referred for counseling on a healthy diet and physical activity. Nutrition  Eat a diet that is high in fiber and potassium, and low in salt (sodium), added sugar, and fat. An example eating plan is called the DASH diet. DASH stands for Dietary Approaches to Stop Hypertension. To eat this way: Eat plenty of fresh fruits and vegetables. Try to fill one-half of your plate at each meal with fruits and vegetables. Eat whole grains, such as whole-wheat pasta, brown rice, or whole-grain bread. Fill about one-fourth of your plate with whole grains. Eat low-fat dairy products. Avoid fatty cuts of meat, processed or cured meats, and poultry with skin. Fill about one-fourth of your plate with lean proteins such as fish, chicken without skin, beans, eggs, and tofu. Avoid pre-made and processed foods. These tend to be higher in sodium, added sugar, and fat. Reduce your daily sodium intake. Many people with hypertension should eat less than 1,500 mg of sodium a day. Lifestyle  Work with your health care provider to maintain a healthy body weight or to lose weight. Ask what an ideal weight is for you. Get at least 30 minutes of exercise that causes your heart to beat faster (aerobic exercise) most days of the week. Activities may include walking, swimming, or biking. Include exercise to strengthen your muscles (resistance exercise), such as weight lifting, as part of your weekly exercise routine. Try to do these types of exercises for 30 minutes at least 3 days a week. Do  not use any products that contain nicotine or tobacco. These products include cigarettes, chewing tobacco, and vaping devices, such as e-cigarettes. If you need help quitting, ask your  health care provider. Control any long-term (chronic) conditions you have, such as high cholesterol or diabetes. Identify your sources of stress and find ways to manage stress. This may include meditation, deep breathing, or making time for fun activities. Alcohol use Do not drink alcohol if: Your health care provider tells you not to drink. You are pregnant, may be pregnant, or are planning to become pregnant. If you drink alcohol: Limit how much you have to: 0-1 drink a day for women. 0-2 drinks a day for men. Know how much alcohol is in your drink. In the [place] one drink equals one 12 oz bottle of beer (355 mL), one 5 oz glass of wine (148 mL), or one 1 oz glass of hard liquor (44 mL). Medicines Your health care provider may prescribe medicine if lifestyle changes are not enough to get your blood pressure under control and if: Your systolic blood pressure is 130 or higher. Your diastolic blood pressure is 80 or higher. Take medicines only as told by your health care provider. Follow the directions carefully. Blood pressure medicines must be taken as told by your health care provider. The medicine does not work as well when you skip doses. Skipping doses also puts you at risk for problems. Monitoring Before you monitor your blood pressure: Do not smoke, drink caffeinated beverages, or exercise within 30 minutes before taking a measurement. Use the bathroom and empty your bladder (urinate). Sit quietly for at least 5 minutes before taking measurements. Monitor your blood pressure at home as told by your health care provider. To do this: Sit with your back straight and supported. Place your feet flat on the floor. Do not cross your legs. Support your arm on a flat surface, such as a table. Make sure your upper arm is at heart level. Each time you measure, take two or three readings one minute apart and record the results. You may also need to have your blood pressure checked regularly by  your health care provider. General information Talk with your health care provider about your diet, exercise habits, and other lifestyle factors that may be contributing to hypertension. Review all the medicines you take with your health care provider because there may be side effects or interactions. Keep all follow-up visits. Your health care provider can help you create and adjust your plan for managing your high blood pressure. Where to find more information National Heart, Lung, and Blood Institute: PopSteam.is American Heart Association: www.heart.org Contact a health care provider if: You think you are having a reaction to medicines you have taken. You have repeated (recurrent) headaches. You feel dizzy. You have swelling in your ankles. You have trouble with your vision. Get help right away if: You develop a severe headache or confusion. You have unusual weakness or numbness, or you feel faint. You have severe pain in your chest or abdomen. You vomit repeatedly. You have trouble breathing. These symptoms may be an emergency. Get help right away. Call 911. Do not wait to see if the symptoms will go away. Do not drive yourself to the hospital. Summary Hypertension is when the force of blood pumping through your arteries is too strong. If this condition is not controlled, it may put you at risk for serious complications. Your personal target blood pressure may vary depending on your medical conditions,  your age, and other factors. For most people, a normal blood pressure is less than 120/80. Hypertension is managed by lifestyle changes, medicines, or both. Lifestyle changes to help manage hypertension include losing weight, eating a healthy, low-sodium diet, exercising more, stopping smoking, and limiting alcohol. This information is not intended to replace advice given to you by your health care provider. Make sure you discuss any questions you have with your health care  provider. Document Revised: 08/17/2021 Document Reviewed: 08/17/2021 Elsevier Patient Education  2024 [employer]DASH Eating Plan DASH stands for Dietary Approaches to Stop Hypertension. The DASH eating plan is a healthy eating plan that has been shown to: Lower high blood pressure (hypertension). Reduce your risk for type 2 diabetes, heart disease, and stroke. Help with weight loss. What are tips for following this plan? Reading food labels Check food labels for the amount of salt (sodium) per serving. Choose foods with less than 5 percent of the Daily Value (DV) of sodium. In general, foods with less than 300 milligrams (mg) of sodium per serving fit into this eating plan. To find whole grains, look for the word "whole" as the first word in the ingredient list. Shopping Buy products labeled as "low-sodium" or "no salt added." Buy fresh foods. Avoid canned foods and pre-made or frozen meals. Cooking Try not to add salt when you cook. Use salt-free seasonings or herbs instead of table salt or sea salt. Check with your health care provider or pharmacist before using salt substitutes. Do not fry foods. Cook foods in healthy ways, such as baking, boiling, grilling, roasting, or broiling. Cook using oils that are good for your heart. These include olive, canola, avocado, soybean, and sunflower oil. Meal planning  Eat a balanced diet. This should include: 4 or more servings of fruits and 4 or more servings of vegetables each day. Try to fill half of your plate with fruits and vegetables. 6-8 servings of whole grains each day. 6 or less servings of lean meat, poultry, or fish each day. 1 oz is 1 serving. A 3 oz (85 g) serving of meat is about the same size as the palm of your hand. One egg is 1 oz (28 g). 2-3 servings of low-fat dairy each day. One serving is 1 cup (237 mL). 1 serving of nuts, seeds, or beans 5 times each week. 2-3 servings of heart-healthy fats. Healthy fats called omega-3  fatty acids are found in foods such as walnuts, flaxseeds, fortified milks, and eggs. These fats are also found in cold-water fish, such as sardines, salmon, and mackerel. Limit how much you eat of: Canned or prepackaged foods. Food that is high in trans fat, such as fried foods. Food that is high in saturated fat, such as fatty meat. Desserts and other sweets, sugary drinks, and other foods with added sugar. Full-fat dairy products. Do not salt foods before eating. Do not eat more than 4 egg yolks a week. Try to eat at least 2 vegetarian meals a week. Eat more home-cooked food and less restaurant, buffet, and fast food. Lifestyle When eating at a restaurant, ask if your food can be made with less salt or no salt. If you drink alcohol: Limit how much you have to: 0-1 drink a day if you are male. 0-2 drinks a day if you are male. Know how much alcohol is in your drink. In the [place] one drink is one 12 oz bottle of beer (355 mL), one 5 oz glass of wine (148 mL),  or one 1 oz glass of hard liquor (44 mL). General information Avoid eating more than 2,300 mg of salt a day. If you have hypertension, you may need to reduce your sodium intake to 1,500 mg a day. Work with your provider to stay at a healthy body weight or lose weight. Ask what the best weight range is for you. On most days of the week, get at least 30 minutes of exercise that causes your heart to beat faster. This may include walking, swimming, or biking. Work with your provider or dietitian to adjust your eating plan to meet your specific calorie needs. What foods should I eat? Fruits All fresh, dried, or frozen fruit. Canned fruits that are in their natural juice and do not have sugar added to them. Vegetables Fresh or frozen vegetables that are raw, steamed, roasted, or grilled. Low-sodium or reduced-sodium tomato and vegetable juice. Low-sodium or reduced-sodium tomato sauce and tomato paste. Low-sodium or reduced-sodium  canned vegetables. Grains Whole-grain or whole-wheat bread. Whole-grain or whole-wheat pasta. Brown rice. Orpah Cobb. Bulgur. Whole-grain and low-sodium cereals. Pita bread. Low-fat, low-sodium crackers. Whole-wheat flour tortillas. Meats and other proteins Skinless chicken or [place]. Ground chicken or [place]. Pork with fat trimmed off. Fish and seafood. Egg whites. Dried beans, peas, or lentils. Unsalted nuts, nut butters, and seeds. Unsalted canned beans. Lean cuts of beef with fat trimmed off. Low-sodium, lean precooked or cured meat, such as sausages or meat loaves. Dairy Low-fat (1%) or fat-free (skim) milk. Reduced-fat, low-fat, or fat-free cheeses. Nonfat, low-sodium ricotta or cottage cheese. Low-fat or nonfat yogurt. Low-fat, low-sodium cheese. Fats and oils Soft margarine without trans fats. Vegetable oil. Reduced-fat, low-fat, or light mayonnaise and salad dressings (reduced-sodium). Canola, safflower, olive, avocado, soybean, and sunflower oils. Avocado. Seasonings and condiments Herbs. Spices. Seasoning mixes without salt. Other foods Unsalted popcorn and pretzels. Fat-free sweets. The items listed above may not be all the foods and drinks you can have. Talk to a dietitian to learn more. What foods should I avoid? Fruits Canned fruit in a light or heavy syrup. Fried fruit. Fruit in cream or butter sauce. Vegetables Creamed or fried vegetables. Vegetables in a cheese sauce. Regular canned vegetables that are not marked as low-sodium or reduced-sodium. Regular canned tomato sauce and paste that are not marked as low-sodium or reduced-sodium. Regular tomato and vegetable juices that are not marked as low-sodium or reduced-sodium. Rosita Fire. Olives. Grains Baked goods made with fat, such as croissants, muffins, or some breads. Dry pasta or rice meal packs. Meats and other proteins Fatty cuts of meat. Ribs. Fried meat. Tomasa Blase. Bologna, salami, and other precooked or cured meats, such  as sausages or meat loaves, that are not lean and low in sodium. Fat from the back of a pig (fatback). Bratwurst. Salted nuts and seeds. Canned beans with added salt. Canned or smoked fish. Whole eggs or egg yolks. Chicken or [place] with skin. Dairy Whole or 2% milk, cream, and half-and-half. Whole or full-fat cream cheese. Whole-fat or sweetened yogurt. Full-fat cheese. Nondairy creamers. Whipped toppings. Processed cheese and cheese spreads. Fats and oils Butter. Stick margarine. Lard. Shortening. Ghee. Bacon fat. Tropical oils, such as coconut, palm kernel, or palm oil. Seasonings and condiments Onion salt, garlic salt, seasoned salt, table salt, and sea salt. Worcestershire sauce. Tartar sauce. Barbecue sauce. Teriyaki sauce. Soy sauce, including reduced-sodium soy sauce. Steak sauce. Canned and packaged gravies. Fish sauce. Oyster sauce. Cocktail sauce. Store-bought horseradish. Ketchup. Mustard. Meat flavorings and tenderizers. Bouillon cubes. Hot sauces.  Pre-made or packaged marinades. Pre-made or packaged taco seasonings. Relishes. Regular salad dressings. Other foods Salted popcorn and pretzels. The items listed above may not be all the foods and drinks you should avoid. Talk to a dietitian to learn more. Where to find more information National Heart, Lung, and Blood Institute (NHLBI): BuffaloDryCleaner.gl American Heart Association (AHA): heart.org Academy of Nutrition and Dietetics: eatright.org National Kidney Foundation (NKF): kidney.org This information is not intended to replace advice given to you by your health care provider. Make sure you discuss any questions you have with your health care provider. Document Revised: 12/20/2022 Document Reviewed: 12/20/2022 Elsevier Patient Education  2024 [employer].

## 2024-05-03 ENCOUNTER — Other Ambulatory Visit: Payer: Self-pay | Admitting: Family Medicine

## 2024-05-03 DIAGNOSIS — I10 Essential (primary) hypertension: Secondary | ICD-10-CM

## 2024-05-14 ENCOUNTER — Encounter: Payer: Self-pay | Admitting: Family Medicine

## 2024-05-14 ENCOUNTER — Ambulatory Visit (INDEPENDENT_AMBULATORY_CARE_PROVIDER_SITE_OTHER): Payer: Self-pay | Admitting: Family Medicine

## 2024-05-14 VITALS — BP 118/74 | HR 106 | Ht 73.0 in | Wt 279.8 lb

## 2024-05-14 DIAGNOSIS — L309 Dermatitis, unspecified: Secondary | ICD-10-CM

## 2024-05-14 DIAGNOSIS — I10 Essential (primary) hypertension: Secondary | ICD-10-CM

## 2024-05-14 DIAGNOSIS — L739 Follicular disorder, unspecified: Secondary | ICD-10-CM

## 2024-05-14 MED ORDER — HYDROCHLOROTHIAZIDE 25 MG PO TABS: 25.0000 mg | ORAL_TABLET | Freq: Every day | ORAL | 1 refills | Status: AC

## 2024-05-14 MED ORDER — CLOTRIMAZOLE-BETAMETHASONE 1-0.05 % EX CREA: TOPICAL_CREAM | CUTANEOUS | 0 refills | Status: AC

## 2024-05-14 MED ORDER — LISINOPRIL 10 MG PO TABS: 10.0000 mg | ORAL_TABLET | Freq: Every day | ORAL | 1 refills | Status: AC

## 2024-05-14 MED ORDER — MUPIROCIN 2 % EX OINT: 1.0000 | TOPICAL_OINTMENT | Freq: Two times a day (BID) | CUTANEOUS | 2 refills | Status: AC

## 2024-05-14 NOTE — Patient Instructions (Signed)
Seborrheic Dermatitis, Adult Seborrheic dermatitis is a skin disease that causes red, scaly patches. It often occurs on the scalp, where it may be called dandruff. The patches may also appear on other parts of the body. Skin patches tend to occur where there are a lot of oil glands in the skin. Areas of the body that may be affected include: The scalp. The face, eyebrows, and ears. The area around a beard. Skin folds of the body. This includes the armpits, groin, and buttocks. The chest. The condition is often long-lasting (chronic). It may come and go for no known reason. It may be activated by a trigger, such as: Cold weather. Being out in the sun. Stress. Drinking alcohol. What are the causes? The cause of this condition is not known. It may be related to having too much yeast on the skin or changes in how your body's disease-fighting system (immune system) works. What increases the risk? You may be more likely to develop this condition if: You have a weak immune system. You are 57 years old or older. You have other conditions, such as: Human immunodeficiency virus (HIV) or acquired immunodeficiency virus (AIDS). Parkinson's disease. Mood disorders, such as depression. Liver problems. Obesity. What are the signs or symptoms? Symptoms of this condition include: Thick scales on the scalp. Redness on the face or in the armpits. Skin that is flaky. The flakes may be white or yellow. Skin that seems oily or dry but is not helped with moisturizers. Itching or burning in the affected areas. How is this diagnosed? This condition is diagnosed with a medical history and physical exam. A sample of your skin may be tested (skin biopsy). You may need to see a skin specialist (dermatologist). How is this treated? There is no cure for this condition, but treatment can help to manage the symptoms. You may get treatment to remove scales, lower the risk of skin infection, and reduce swelling or  itching. Treatment may include: Medicated shampoos, moisturizing creams, or ointments. Creams that reduce skin yeast. Creams that reduce swelling and irritation (steroids). Follow these instructions at home: Skin care Use any medicated shampoo, skin creams, or ointments only as told by your health care provider. Do not use skin products that contain alcohol. Take lukewarm baths or showers. Avoid very hot water. When you are outside, wear a hat and clothes that block UV light. General instructions Apply over-the-counter and prescription medicines only as told by your health care provider. Learn what triggers your symptoms so you can avoid these things. Use techniques for stress reduction, such as meditation or yoga. Do not drink alcohol if your health care provider tells you not to drink. Keep all follow-up visits. Your health care provider will check your skin to make sure the treatments are helping. Where to find more information American Academy of Dermatology: MarketingSheets.si Contact a health care provider if: Your symptoms do not get better with treatment. Your symptoms get worse. You have new symptoms. Get help right away if: Your condition quickly gets worse, even with treatment. This information is not intended to replace advice given to you by your health care provider. Make sure you discuss any questions you have with your health care provider. Document Revised: 05/04/2022 Document Reviewed: 05/04/2022 Elsevier Patient Education  2024 [employer].

## 2024-05-14 NOTE — Progress Notes (Signed)
 Date:  05/14/2024   Name:  Adam Olson   DOB:  04-11-1966   MRN:  [id number]   Chief Complaint: Hypertension  Hypertension This is a chronic problem. The current episode started more than 1 year ago. The problem has been gradually improving since onset. The problem is controlled. Pertinent negatives include no anxiety, blurred vision, chest pain, headaches, malaise/fatigue, neck pain, orthopnea, palpitations, peripheral edema, PND, shortness of breath or sweats. Risk factors for coronary artery disease include stress.    Lab Results  Component Value Date   NA 136 01/15/2024   K 4.0 01/15/2024   CO2 20 01/15/2024   GLUCOSE 104 (H) 01/15/2024   BUN 27 (H) 01/15/2024   CREATININE 1.43 (H) 01/15/2024   CALCIUM  9.7 01/15/2024   EGFR 58 (L) 01/15/2024   GFRNONAA 82 09/30/2020   Lab Results  Component Value Date   CHOL 193 01/15/2024   HDL 35 (L) 01/15/2024   LDLCALC 126 (H) 01/15/2024   TRIG 181 (H) 01/15/2024   CHOLHDL 6.3 (H) 05/22/2016   No results found for: "TSH" Lab Results  Component Value Date   HGBA1C 5.5 01/15/2024   Lab Results  Component Value Date   WBC 9.4 02/07/2024   HGB 12.3 (L) 02/07/2024   HCT 36.8 (L) 02/07/2024   MCV 81.4 02/07/2024   PLT 174 02/07/2024   Lab Results  Component Value Date   ALT 40 09/14/2022   AST 48 (H) 09/14/2022   ALKPHOS 119 09/14/2022   BILITOT 0.9 09/14/2022   No results found for: "25OHVITD2", "25OHVITD3", "VD25OH"   Review of Systems  Constitutional:  Negative for malaise/fatigue and unexpected weight change.  Eyes:  Negative for blurred vision.  Respiratory:  Negative for cough, chest tightness, shortness of breath and wheezing.   Cardiovascular:  Negative for chest pain, palpitations, orthopnea, leg swelling and PND.  Musculoskeletal:  Negative for neck pain.  Skin:  Positive for rash.  Neurological:  Negative for headaches.    Patient Active Problem List   Diagnosis Date Noted   Mixed hyperlipidemia  09/11/2021   Essential hypertension 09/11/2021   Routine general medical examination at a health care facility 05/02/2015   Dermatitis, eczematoid 05/02/2015   Blood pressure elevated 05/02/2015   Adiposity 05/02/2015    No Known Allergies  Past Surgical History:  Procedure Laterality Date   LIPOMA EXCISION     scapula   RECONSTRUCTION OF EYELID      Social History   Tobacco Use   Smoking status: Every Day    Types: Cigars   Smokeless tobacco: Never  Substance Use Topics   Alcohol use: Yes    Alcohol/week: 0.0 standard drinks of alcohol   Drug use: No     Medication list has been reviewed and updated.  Current Meds  Medication Sig   atorvastatin  (LIPITOR) 10 MG tablet Take 1 tablet (10 mg total) by mouth daily.   clotrimazole -betamethasone  (LOTRISONE ) cream APPLY TOPICALLY TO AFFECTED AREA EVERY DAY   docusate sodium (COLACE) 100 MG capsule Take 1 capsule twice a day by oral route.   hydrochlorothiazide  (MICROZIDE ) 12.5 MG capsule TAKE 1 CAPSULE BY MOUTH EVERY DAY   lisinopril -hydrochlorothiazide  (ZESTORETIC ) 10-12.5 MG tablet Take 1 tablet by mouth daily.   metFORMIN  (GLUCOPHAGE ) 500 MG tablet TAKE 1 TABLET BY MOUTH twice a day   mupirocin  ointment (BACTROBAN ) 2 % Apply 1 application topically 2 (two) times daily.   omeprazole (PRILOSEC) 40 MG capsule Take 40 mg by mouth 2 (  two) times daily.   tadalafil  (CIALIS ) 5 MG tablet Take 1 tablet (5 mg total) by mouth daily as needed for erectile dysfunction.       05/14/2024    8:41 AM 04/20/2024    8:07 AM 02/07/2024    9:44 AM 12/20/2023    2:16 PM  GAD 7 : Generalized Anxiety Score  Nervous, Anxious, on Edge 0 0 0 0  Control/stop worrying 0 0 0 0  Worry too much - different things 0 0 0 0  Trouble relaxing 0 0 0 0  Restless 0 0 0 0  Easily annoyed or irritable 0 0 0 0  Afraid - awful might happen 0 0 0 0  Total GAD 7 Score 0 0 0 0  Anxiety Difficulty Not difficult at all Not difficult at all Not difficult at all Not  difficult at all       05/14/2024    8:41 AM 04/20/2024    8:07 AM 02/07/2024    9:44 AM  Depression screen PHQ 2/9  Decreased Interest 0 0 0  Down, Depressed, Hopeless 0 0 0  PHQ - 2 Score 0 0 0  Altered sleeping 0 0   Tired, decreased energy 0 0   Change in appetite 0 0   Feeling bad or failure about yourself  0 0   Trouble concentrating 0 0   Moving slowly or fidgety/restless 0 0   Suicidal thoughts 0 0   PHQ-9 Score 0 0   Difficult doing work/chores Not difficult at all Not difficult at all     BP Readings from Last 3 Encounters:  05/14/24 118/74  04/20/24 (!) 152/90  02/07/24 110/76    Physical Exam Vitals and nursing note reviewed.  Constitutional:      Appearance: He is well-developed.  HENT:     Head: Normocephalic and atraumatic.     Right Ear: Tympanic membrane, ear canal and external ear normal.     Left Ear: Tympanic membrane, ear canal and external ear normal.     Nose: Nose normal.     Mouth/Throat:     Dentition: Normal dentition.  Eyes:     General: Lids are normal. No scleral icterus.    Conjunctiva/sclera: Conjunctivae normal.     Pupils: Pupils are equal, round, and reactive to light.  Neck:     Thyroid: No thyromegaly.     Vascular: No carotid bruit, hepatojugular reflux or JVD.     Trachea: No tracheal deviation.  Cardiovascular:     Rate and Rhythm: Normal rate and regular rhythm.     Heart sounds: Normal heart sounds.  Pulmonary:     Effort: Pulmonary effort is normal.     Breath sounds: Normal breath sounds. No wheezing, rhonchi or rales.  Abdominal:     General: Bowel sounds are normal.     Palpations: Abdomen is soft. There is no hepatomegaly, splenomegaly or mass.     Tenderness: There is no abdominal tenderness.     Hernia: There is no hernia in the left inguinal area.  Musculoskeletal:        General: Normal range of motion.     Cervical back: Normal range of motion and neck supple.  Lymphadenopathy:     Cervical: No cervical  adenopathy.  Skin:    General: Skin is warm and dry.     Findings: No rash.  Neurological:     Mental Status: He is alert and oriented to person, place, and time.  Sensory: No sensory deficit.     Deep Tendon Reflexes: Reflexes are normal and symmetric.  Psychiatric:        Mood and Affect: Mood is not anxious or depressed.     Wt Readings from Last 3 Encounters:  05/14/24 279 lb 12.8 oz (126.9 kg)  04/20/24 278 lb 6.4 oz (126.3 kg)  02/07/24 256 lb (116.1 kg)    BP 118/74   Pulse (!) 106   Ht 6\' 1"  (1.854 m)   Wt 279 lb 12.8 oz (126.9 kg)   SpO2 96%   BMI 36.92 kg/m   Assessment and Plan: 1. Essential hypertension (Primary) Chronic.  Controlled.  Stable.  Asymptomatic.  Current blood pressure is 118/74.  Asymptomatic.  Tolerating current dosings of hydrochlorothiazide  25 mg once a day and lisinopril  10 mg once a day.  At this point in time we will be separating out of the combination of lisinopril  hydrochlorothiazide  and prescribing each in its own individual dosing of 1 pill a day.  We will check renal function panel for current level of electrolytes and GFR. - hydrochlorothiazide  (HYDRODIURIL ) 25 MG tablet; Take 1 tablet (25 mg total) by mouth daily.  Dispense: 90 tablet; Refill: 1 - lisinopril  (ZESTRIL ) 10 MG tablet; Take 1 tablet (10 mg total) by mouth daily.  Dispense: 90 tablet; Refill: 1 - Renal Function Panel  2. Eczema, unspecified type Chronic.  Controlled.  Stable.  There is an irritation of the ear area patient says that is scaly comes and goes probably is of a seborrheic dermatitis in nature.  I have suggested to pick up Nizoral shampoo to use on a more daily basis with the Lotrisone  on a as needed but limited basis since this is in the ear area. - clotrimazole -betamethasone  (LOTRISONE ) cream; APPLY TOPICALLY TO AFFECTED AREA EVERY DAY  Dispense: 30 g; Refill: 0  3. Folliculitis Episodic.  Patient has intermittent breakout of follicular areas that are secondary  infected presumably from staph and we will topically treat that with Bactroban  ointment applied twice a day as needed. - mupirocin  ointment (BACTROBAN ) 2 %; Apply 1 Application topically 2 (two) times daily.  Dispense: 22 g; Refill: 2     Alayne Allis, MD

## 2024-05-15 ENCOUNTER — Ambulatory Visit: Payer: Self-pay | Admitting: Family Medicine

## 2024-05-15 LAB — RENAL FUNCTION PANEL
Albumin: 4.7 g/dL (ref 3.8–4.9)
BUN/Creatinine Ratio: 10 (ref 9–20)
BUN: 12 mg/dL (ref 6–24)
CO2: 22 mmol/L (ref 20–29)
Calcium: 9.6 mg/dL (ref 8.7–10.2)
Chloride: 97 mmol/L (ref 96–106)
Creatinine, Ser: 1.21 mg/dL (ref 0.76–1.27)
Glucose: 124 mg/dL — ABNORMAL HIGH (ref 70–99)
Phosphorus: 4.4 mg/dL — ABNORMAL HIGH (ref 2.8–4.1)
Potassium: 5.2 mmol/L (ref 3.5–5.2)
Sodium: 142 mmol/L (ref 134–144)
eGFR: 70 mL/min/{1.73_m2} (ref >59)

## 2024-09-07 ENCOUNTER — Other Ambulatory Visit: Payer: Self-pay | Admitting: Student

## 2024-09-14 ENCOUNTER — Encounter: Payer: Self-pay | Admitting: Student
